# Patient Record
Sex: Female | Born: 1940 | Race: White | Hispanic: No | Smoking: Never smoker
Health system: Southern US, Community
[De-identification: ages and names within clinical notes are randomized; demographics above are authoritative.]

## PROBLEM LIST (undated history)

## (undated) DIAGNOSIS — I340 Nonrheumatic mitral (valve) insufficiency: Secondary | ICD-10-CM

## (undated) DIAGNOSIS — C50919 Malignant neoplasm of unspecified site of unspecified female breast: Secondary | ICD-10-CM

## (undated) DIAGNOSIS — I10 Essential (primary) hypertension: Secondary | ICD-10-CM

## (undated) DIAGNOSIS — H269 Unspecified cataract: Secondary | ICD-10-CM

## (undated) DIAGNOSIS — I2510 Atherosclerotic heart disease of native coronary artery without angina pectoris: Secondary | ICD-10-CM

## (undated) DIAGNOSIS — C801 Malignant (primary) neoplasm, unspecified: Secondary | ICD-10-CM

## (undated) DIAGNOSIS — E039 Hypothyroidism, unspecified: Secondary | ICD-10-CM

## (undated) DIAGNOSIS — C189 Malignant neoplasm of colon, unspecified: Secondary | ICD-10-CM

## (undated) DIAGNOSIS — M1990 Unspecified osteoarthritis, unspecified site: Secondary | ICD-10-CM

## (undated) HISTORY — PX: COLONOSCOPY: SHX174

## (undated) HISTORY — DX: Unspecified cataract: H26.9

## (undated) HISTORY — PX: FOOT SURGERY: SHX648

## (undated) HISTORY — DX: Malignant neoplasm of colon, unspecified: C18.9

## (undated) HISTORY — DX: Malignant neoplasm of unspecified site of unspecified female breast: C50.919

## (undated) HISTORY — DX: Unspecified osteoarthritis, unspecified site: M19.90

## (undated) HISTORY — DX: Essential (primary) hypertension: I10

## (undated) HISTORY — PX: BREAST LUMPECTOMY: SHX2

## (undated) HISTORY — PX: VAGINAL HYSTERECTOMY: SUR661

## (undated) HISTORY — DX: Nonrheumatic mitral (valve) insufficiency: I34.0

## (undated) HISTORY — PX: COLON SURGERY: SHX602

## (undated) HISTORY — DX: Malignant (primary) neoplasm, unspecified: C80.1

## (undated) HISTORY — PX: BUNIONECTOMY: SHX129

---

## 2000-02-14 ENCOUNTER — Other Ambulatory Visit: Admission: RE | Admit: 2000-02-14 | Discharge: 2000-02-14 | Payer: Self-pay | Admitting: Obstetrics and Gynecology

## 2001-02-26 ENCOUNTER — Other Ambulatory Visit: Admission: RE | Admit: 2001-02-26 | Discharge: 2001-02-26 | Payer: Self-pay | Admitting: Obstetrics and Gynecology

## 2001-04-02 ENCOUNTER — Encounter: Payer: Self-pay | Admitting: Obstetrics and Gynecology

## 2001-04-02 ENCOUNTER — Encounter: Admission: RE | Admit: 2001-04-02 | Discharge: 2001-04-02 | Payer: Self-pay | Admitting: Obstetrics and Gynecology

## 2001-09-23 ENCOUNTER — Encounter: Payer: Self-pay | Admitting: General Surgery

## 2001-09-25 ENCOUNTER — Encounter (INDEPENDENT_AMBULATORY_CARE_PROVIDER_SITE_OTHER): Payer: Self-pay | Admitting: Specialist

## 2001-09-25 ENCOUNTER — Inpatient Hospital Stay (HOSPITAL_COMMUNITY): Admission: RE | Admit: 2001-09-25 | Discharge: 2001-09-30 | Payer: Self-pay | Admitting: General Surgery

## 2001-10-27 ENCOUNTER — Encounter: Admission: RE | Admit: 2001-10-27 | Discharge: 2001-10-27 | Payer: Self-pay | Admitting: Oncology

## 2001-10-27 ENCOUNTER — Encounter (HOSPITAL_COMMUNITY): Admission: RE | Admit: 2001-10-27 | Discharge: 2001-11-26 | Payer: Self-pay | Admitting: Oncology

## 2002-01-21 ENCOUNTER — Encounter (HOSPITAL_COMMUNITY): Admission: RE | Admit: 2002-01-21 | Discharge: 2002-02-20 | Payer: Self-pay | Admitting: Oncology

## 2002-01-21 ENCOUNTER — Encounter: Admission: RE | Admit: 2002-01-21 | Discharge: 2002-01-21 | Payer: Self-pay | Admitting: Oncology

## 2002-03-18 ENCOUNTER — Other Ambulatory Visit: Admission: RE | Admit: 2002-03-18 | Discharge: 2002-03-18 | Payer: Self-pay | Admitting: Gynecology

## 2002-05-06 ENCOUNTER — Encounter: Admission: RE | Admit: 2002-05-06 | Discharge: 2002-05-06 | Payer: Self-pay | Admitting: Oncology

## 2002-05-06 ENCOUNTER — Encounter: Payer: Self-pay | Admitting: Gynecology

## 2002-05-06 ENCOUNTER — Encounter (HOSPITAL_COMMUNITY): Admission: RE | Admit: 2002-05-06 | Discharge: 2002-06-05 | Payer: Self-pay | Admitting: Oncology

## 2002-05-06 ENCOUNTER — Encounter: Admission: RE | Admit: 2002-05-06 | Discharge: 2002-05-06 | Payer: Self-pay | Admitting: Gynecology

## 2002-08-05 ENCOUNTER — Encounter: Admission: RE | Admit: 2002-08-05 | Discharge: 2002-08-05 | Payer: Self-pay | Admitting: Oncology

## 2002-08-05 ENCOUNTER — Encounter (HOSPITAL_COMMUNITY): Admission: RE | Admit: 2002-08-05 | Discharge: 2002-09-04 | Payer: Self-pay | Admitting: Oncology

## 2002-10-28 ENCOUNTER — Encounter (HOSPITAL_COMMUNITY): Admission: RE | Admit: 2002-10-28 | Discharge: 2002-11-27 | Payer: Self-pay | Admitting: Oncology

## 2002-10-28 ENCOUNTER — Encounter: Admission: RE | Admit: 2002-10-28 | Discharge: 2002-10-28 | Payer: Self-pay | Admitting: Oncology

## 2003-02-03 ENCOUNTER — Encounter (HOSPITAL_COMMUNITY): Admission: RE | Admit: 2003-02-03 | Discharge: 2003-02-25 | Payer: Self-pay | Admitting: Oncology

## 2003-02-03 ENCOUNTER — Encounter: Admission: RE | Admit: 2003-02-03 | Discharge: 2003-02-03 | Payer: Self-pay | Admitting: Oncology

## 2003-03-31 ENCOUNTER — Other Ambulatory Visit: Admission: RE | Admit: 2003-03-31 | Discharge: 2003-03-31 | Payer: Self-pay | Admitting: Gynecology

## 2003-05-12 ENCOUNTER — Encounter (HOSPITAL_COMMUNITY): Admission: RE | Admit: 2003-05-12 | Discharge: 2003-06-11 | Payer: Self-pay | Admitting: Oncology

## 2003-05-12 ENCOUNTER — Encounter: Admission: RE | Admit: 2003-05-12 | Discharge: 2003-05-12 | Payer: Self-pay | Admitting: Oncology

## 2003-06-09 ENCOUNTER — Encounter: Admission: RE | Admit: 2003-06-09 | Discharge: 2003-06-09 | Payer: Self-pay | Admitting: Gynecology

## 2003-08-11 ENCOUNTER — Encounter: Admission: RE | Admit: 2003-08-11 | Discharge: 2003-08-11 | Payer: Self-pay | Admitting: Oncology

## 2003-08-11 ENCOUNTER — Encounter (HOSPITAL_COMMUNITY): Admission: RE | Admit: 2003-08-11 | Discharge: 2003-09-10 | Payer: Self-pay | Admitting: Oncology

## 2003-11-10 ENCOUNTER — Encounter: Admission: RE | Admit: 2003-11-10 | Discharge: 2003-11-10 | Payer: Self-pay | Admitting: Oncology

## 2003-11-10 ENCOUNTER — Encounter (HOSPITAL_COMMUNITY): Admission: RE | Admit: 2003-11-10 | Discharge: 2003-12-10 | Payer: Self-pay | Admitting: Oncology

## 2004-02-18 ENCOUNTER — Encounter (HOSPITAL_COMMUNITY): Admission: RE | Admit: 2004-02-18 | Discharge: 2004-02-25 | Payer: Self-pay | Admitting: Oncology

## 2004-02-18 ENCOUNTER — Encounter: Admission: RE | Admit: 2004-02-18 | Discharge: 2004-02-25 | Payer: Self-pay | Admitting: Oncology

## 2004-03-22 ENCOUNTER — Other Ambulatory Visit: Admission: RE | Admit: 2004-03-22 | Discharge: 2004-03-22 | Payer: Self-pay | Admitting: Gynecology

## 2004-06-07 ENCOUNTER — Encounter (HOSPITAL_COMMUNITY): Admission: RE | Admit: 2004-06-07 | Discharge: 2004-07-07 | Payer: Self-pay | Admitting: Oncology

## 2004-06-07 ENCOUNTER — Ambulatory Visit (HOSPITAL_COMMUNITY): Payer: Self-pay | Admitting: Oncology

## 2004-06-07 ENCOUNTER — Encounter: Admission: RE | Admit: 2004-06-07 | Discharge: 2004-06-07 | Payer: Self-pay | Admitting: Oncology

## 2004-06-14 ENCOUNTER — Encounter: Admission: RE | Admit: 2004-06-14 | Discharge: 2004-06-14 | Payer: Self-pay | Admitting: Gynecology

## 2004-08-30 ENCOUNTER — Ambulatory Visit (HOSPITAL_COMMUNITY): Payer: Self-pay | Admitting: Oncology

## 2004-08-30 ENCOUNTER — Encounter (HOSPITAL_COMMUNITY): Admission: RE | Admit: 2004-08-30 | Discharge: 2004-09-29 | Payer: Self-pay | Admitting: Oncology

## 2004-08-30 ENCOUNTER — Encounter: Admission: RE | Admit: 2004-08-30 | Discharge: 2004-08-30 | Payer: Self-pay | Admitting: Oncology

## 2004-09-20 ENCOUNTER — Ambulatory Visit: Payer: Self-pay | Admitting: Gastroenterology

## 2004-10-02 ENCOUNTER — Ambulatory Visit: Payer: Self-pay | Admitting: Gastroenterology

## 2004-10-25 ENCOUNTER — Encounter (HOSPITAL_COMMUNITY): Admission: RE | Admit: 2004-10-25 | Discharge: 2004-11-24 | Payer: Self-pay | Admitting: Oncology

## 2004-10-25 ENCOUNTER — Ambulatory Visit (HOSPITAL_COMMUNITY): Payer: Self-pay | Admitting: Oncology

## 2004-10-25 ENCOUNTER — Encounter: Admission: RE | Admit: 2004-10-25 | Discharge: 2004-10-25 | Payer: Self-pay | Admitting: Oncology

## 2005-01-24 ENCOUNTER — Ambulatory Visit (HOSPITAL_COMMUNITY): Payer: Self-pay | Admitting: Oncology

## 2005-01-24 ENCOUNTER — Encounter: Admission: RE | Admit: 2005-01-24 | Discharge: 2005-02-23 | Payer: Self-pay | Admitting: Oncology

## 2005-01-24 ENCOUNTER — Encounter (HOSPITAL_COMMUNITY): Admission: RE | Admit: 2005-01-24 | Discharge: 2005-02-23 | Payer: Self-pay | Admitting: Oncology

## 2005-03-28 ENCOUNTER — Other Ambulatory Visit: Admission: RE | Admit: 2005-03-28 | Discharge: 2005-03-28 | Payer: Self-pay | Admitting: Gynecology

## 2005-06-27 ENCOUNTER — Encounter: Admission: RE | Admit: 2005-06-27 | Discharge: 2005-06-27 | Payer: Self-pay | Admitting: Gynecology

## 2005-06-27 ENCOUNTER — Encounter (HOSPITAL_COMMUNITY): Admission: RE | Admit: 2005-06-27 | Discharge: 2005-07-27 | Payer: Self-pay | Admitting: Oncology

## 2005-06-27 ENCOUNTER — Ambulatory Visit (HOSPITAL_COMMUNITY): Payer: Self-pay | Admitting: Oncology

## 2005-07-04 ENCOUNTER — Encounter (INDEPENDENT_AMBULATORY_CARE_PROVIDER_SITE_OTHER): Payer: Self-pay | Admitting: Diagnostic Radiology

## 2005-07-04 ENCOUNTER — Encounter: Admission: RE | Admit: 2005-07-04 | Discharge: 2005-07-04 | Payer: Self-pay | Admitting: Gynecology

## 2005-07-04 ENCOUNTER — Encounter (INDEPENDENT_AMBULATORY_CARE_PROVIDER_SITE_OTHER): Payer: Self-pay | Admitting: Specialist

## 2005-07-12 ENCOUNTER — Encounter: Admission: RE | Admit: 2005-07-12 | Discharge: 2005-07-12 | Payer: Self-pay | Admitting: General Surgery

## 2005-08-03 ENCOUNTER — Encounter: Admission: RE | Admit: 2005-08-03 | Discharge: 2005-08-03 | Payer: Self-pay | Admitting: General Surgery

## 2005-08-03 ENCOUNTER — Ambulatory Visit (HOSPITAL_BASED_OUTPATIENT_CLINIC_OR_DEPARTMENT_OTHER): Admission: RE | Admit: 2005-08-03 | Discharge: 2005-08-03 | Payer: Self-pay | Admitting: General Surgery

## 2005-08-03 ENCOUNTER — Encounter (INDEPENDENT_AMBULATORY_CARE_PROVIDER_SITE_OTHER): Payer: Self-pay | Admitting: *Deleted

## 2005-08-21 ENCOUNTER — Encounter: Admission: RE | Admit: 2005-08-21 | Discharge: 2005-08-21 | Payer: Self-pay | Admitting: Oncology

## 2005-08-21 ENCOUNTER — Encounter (HOSPITAL_COMMUNITY): Admission: RE | Admit: 2005-08-21 | Discharge: 2005-09-20 | Payer: Self-pay | Admitting: Oncology

## 2005-08-21 ENCOUNTER — Ambulatory Visit (HOSPITAL_COMMUNITY): Payer: Self-pay | Admitting: Oncology

## 2005-09-05 ENCOUNTER — Ambulatory Visit: Admission: RE | Admit: 2005-09-05 | Discharge: 2005-11-20 | Payer: Self-pay | Admitting: Radiation Oncology

## 2005-12-05 ENCOUNTER — Ambulatory Visit (HOSPITAL_COMMUNITY): Payer: Self-pay | Admitting: Oncology

## 2005-12-05 ENCOUNTER — Encounter: Admission: RE | Admit: 2005-12-05 | Discharge: 2005-12-05 | Payer: Self-pay | Admitting: Oncology

## 2006-02-27 ENCOUNTER — Encounter (HOSPITAL_COMMUNITY): Admission: RE | Admit: 2006-02-27 | Discharge: 2006-03-29 | Payer: Self-pay | Admitting: Oncology

## 2006-02-27 ENCOUNTER — Encounter: Admission: RE | Admit: 2006-02-27 | Discharge: 2006-02-27 | Payer: Self-pay | Admitting: Oncology

## 2006-02-27 ENCOUNTER — Ambulatory Visit (HOSPITAL_COMMUNITY): Payer: Self-pay | Admitting: Oncology

## 2006-03-20 ENCOUNTER — Encounter: Admission: RE | Admit: 2006-03-20 | Discharge: 2006-03-20 | Payer: Self-pay | Admitting: Oncology

## 2006-04-11 ENCOUNTER — Other Ambulatory Visit: Admission: RE | Admit: 2006-04-11 | Discharge: 2006-04-11 | Payer: Self-pay | Admitting: Gynecology

## 2006-05-08 ENCOUNTER — Ambulatory Visit (HOSPITAL_COMMUNITY): Payer: Self-pay | Admitting: Oncology

## 2006-06-05 ENCOUNTER — Encounter (HOSPITAL_COMMUNITY): Admission: RE | Admit: 2006-06-05 | Discharge: 2006-07-05 | Payer: Self-pay | Admitting: Oncology

## 2006-07-03 ENCOUNTER — Encounter: Admission: RE | Admit: 2006-07-03 | Discharge: 2006-07-03 | Payer: Self-pay | Admitting: Gynecology

## 2006-08-21 ENCOUNTER — Ambulatory Visit (HOSPITAL_COMMUNITY): Payer: Self-pay | Admitting: Oncology

## 2006-08-21 ENCOUNTER — Encounter (HOSPITAL_COMMUNITY): Admission: RE | Admit: 2006-08-21 | Discharge: 2006-09-20 | Payer: Self-pay | Admitting: Oncology

## 2006-12-11 ENCOUNTER — Ambulatory Visit (HOSPITAL_COMMUNITY): Payer: Self-pay | Admitting: Oncology

## 2006-12-11 ENCOUNTER — Encounter (HOSPITAL_COMMUNITY): Admission: RE | Admit: 2006-12-11 | Discharge: 2007-01-10 | Payer: Self-pay | Admitting: Oncology

## 2007-02-27 ENCOUNTER — Encounter (HOSPITAL_COMMUNITY): Admission: RE | Admit: 2007-02-27 | Discharge: 2007-03-29 | Payer: Self-pay | Admitting: Oncology

## 2007-02-27 ENCOUNTER — Ambulatory Visit (HOSPITAL_COMMUNITY): Payer: Self-pay | Admitting: Oncology

## 2007-05-01 ENCOUNTER — Other Ambulatory Visit: Admission: RE | Admit: 2007-05-01 | Discharge: 2007-05-01 | Payer: Self-pay | Admitting: Gynecology

## 2007-06-20 ENCOUNTER — Ambulatory Visit (HOSPITAL_COMMUNITY): Payer: Self-pay | Admitting: Oncology

## 2007-07-17 ENCOUNTER — Encounter: Admission: RE | Admit: 2007-07-17 | Discharge: 2007-07-17 | Payer: Self-pay | Admitting: Gynecology

## 2007-09-18 ENCOUNTER — Encounter (HOSPITAL_COMMUNITY): Admission: RE | Admit: 2007-09-18 | Discharge: 2007-10-18 | Payer: Self-pay | Admitting: Oncology

## 2007-09-18 ENCOUNTER — Ambulatory Visit (HOSPITAL_COMMUNITY): Payer: Self-pay | Admitting: Oncology

## 2007-11-27 ENCOUNTER — Telehealth: Payer: Self-pay | Admitting: Gastroenterology

## 2007-12-11 ENCOUNTER — Encounter (HOSPITAL_COMMUNITY): Admission: RE | Admit: 2007-12-11 | Discharge: 2008-01-10 | Payer: Self-pay | Admitting: Oncology

## 2007-12-11 ENCOUNTER — Encounter: Payer: Self-pay | Admitting: Gastroenterology

## 2007-12-11 ENCOUNTER — Ambulatory Visit (HOSPITAL_COMMUNITY): Payer: Self-pay | Admitting: Oncology

## 2008-04-08 ENCOUNTER — Ambulatory Visit: Payer: Self-pay | Admitting: Gastroenterology

## 2008-04-19 ENCOUNTER — Ambulatory Visit: Payer: Self-pay | Admitting: Gastroenterology

## 2008-06-25 ENCOUNTER — Ambulatory Visit (HOSPITAL_COMMUNITY): Payer: Self-pay | Admitting: Oncology

## 2008-06-25 ENCOUNTER — Encounter: Payer: Self-pay | Admitting: Gastroenterology

## 2008-07-22 ENCOUNTER — Encounter: Admission: RE | Admit: 2008-07-22 | Discharge: 2008-07-22 | Payer: Self-pay | Admitting: Gynecology

## 2008-12-24 ENCOUNTER — Encounter: Payer: Self-pay | Admitting: Gastroenterology

## 2008-12-24 ENCOUNTER — Encounter (HOSPITAL_COMMUNITY): Admission: RE | Admit: 2008-12-24 | Discharge: 2009-01-23 | Payer: Self-pay | Admitting: Oncology

## 2008-12-24 ENCOUNTER — Ambulatory Visit (HOSPITAL_COMMUNITY): Payer: Self-pay | Admitting: Oncology

## 2009-07-05 ENCOUNTER — Ambulatory Visit (HOSPITAL_COMMUNITY): Payer: Self-pay | Admitting: Oncology

## 2009-07-25 ENCOUNTER — Encounter: Admission: RE | Admit: 2009-07-25 | Discharge: 2009-07-25 | Payer: Self-pay | Admitting: Gynecology

## 2010-02-06 ENCOUNTER — Ambulatory Visit (HOSPITAL_COMMUNITY): Payer: Self-pay | Admitting: Oncology

## 2010-02-06 ENCOUNTER — Encounter: Payer: Self-pay | Admitting: Gastroenterology

## 2010-06-18 ENCOUNTER — Encounter: Payer: Self-pay | Admitting: Gynecology

## 2010-06-18 ENCOUNTER — Encounter: Payer: Self-pay | Admitting: Family Medicine

## 2010-06-27 NOTE — Letter (Signed)
Summary: [facility]  [facility]   Imported By: Lester [place] 02/21/2010 13:12:24  _____________________________________________________________________  External Attachment:    Type:   Image     Comment:   External Document

## 2010-07-24 ENCOUNTER — Other Ambulatory Visit: Payer: Self-pay | Admitting: Gynecology

## 2010-07-24 DIAGNOSIS — Z9889 Other specified postprocedural states: Secondary | ICD-10-CM

## 2010-08-08 ENCOUNTER — Ambulatory Visit
Admission: RE | Admit: 2010-08-08 | Discharge: 2010-08-08 | Disposition: A | Discharge status: Home / Self Care | Payer: PRIVATE HEALTH INSURANCE | Source: Ambulatory Visit | Attending: Gynecology | Admitting: Gynecology

## 2010-08-08 ENCOUNTER — Other Ambulatory Visit: Payer: Self-pay | Admitting: Gynecology

## 2010-08-08 DIAGNOSIS — Z9889 Other specified postprocedural states: Secondary | ICD-10-CM

## 2010-09-03 LAB — DIFFERENTIAL
Lymphs Abs: 1.2 10*3/uL (ref 0.7–4.0)
Monocytes Relative: 9 % (ref 3–12)
Neutrophils Relative %: 68 % (ref 43–77)

## 2010-09-03 LAB — COMPREHENSIVE METABOLIC PANEL
BUN: 9 mg/dL (ref 6–23)
Chloride: 102 mEq/L (ref 96–112)
GFR calc Af Amer: >60 mL/min (ref >60)
GFR calc non Af Amer: >60 mL/min (ref >60)
Potassium: 3.8 mEq/L (ref 3.5–5.1)
Total Bilirubin: 0.5 mg/dL (ref 0.3–1.2)

## 2010-09-03 LAB — CEA: CEA: 1.3 ng/mL (ref 0.0–5.0)

## 2010-09-03 LAB — CBC
HCT: 39.5 % (ref 36.0–46.0)
Hemoglobin: 14.1 g/dL (ref 12.0–15.0)
MCV: 93.8 fL (ref 78.0–100.0)

## 2010-10-13 NOTE — Op Note (Signed)
[facility]  Patient:    Kathryn Powell, Kathryn Powell Visit Number: [id number] MRN: [id number]          Service Type: SUR Location: 3W 0358 01 Attending Physician:  Carson Myrtle Dictated by:   Sheppard Plumber Earlene Plater, M.D. Proc. Date: 09/25/01 Admit Date:  09/25/2001   CC:         Vania Rea. Jarold Motto, M.D. 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  PROCEDURE:  Sigmoid colectomy.  SURGEON:  Timothy E. Earlene Plater, M.D.  ASSISTANT:  Sandria Bales. Ezzard Standing, M.D.  ANESTHESIA:  CRNA supervised Dr. Kateri Plummer.  INDICATIONS FOR PROCEDURE:  Kathryn Powell is a healthy, 61 and had some increasing constipation recently and several months history of blood in the stool, Hemoccult positive. Colonoscopy by Dr. Jarold Motto revealed near obstructing sigmoid carcinoma. She was rapidly prepared for surgery and agrees and understands the proposed procedure. Her preoperative PA and lateral chest, EKG, chemistry profile and CBC were normal. CEA level was 1.3.  The patient was prepared at home, brought into the hospital this morning, evaluated by anesthesia and was ready for surgery. IV started, hydration begun and preoperative antibiotics given.  DESCRIPTION OF PROCEDURE:  The patient was taken to the operating room, placed supine, general endotracheal anesthesia administered. PAS hose applied, a Foley catheter inserted and she was placed in the yellowfin stirrups and placed supine. A nasogastric tube passed. The abdomen was scrubbed, prepped and draped in the usual fashion. A short lower midline incision was used to enter the abdominal cavity. Careful palpation of the upper abdominal viscera were all negative. The colon was carefully palpated in its entirety and there were no other lesions. A moderate apple core type lesion was found in the sigmoid colon which was coiled  in the pelvis and adherent by simple adhesions to the left pelvic sidewall. The peritoneal reflection was divided from the descending colon to the rectum. The adhesions were divided. The left tube exhibited hydrosalpinx. No other disease was noted. Sites were chosen for resection at the upper rectum and mid sigmoid. There was no other disease of the sigmoid colon. The colon was divided proximally. The mesentery was divided down to the posterior peritoneum where both ureters were identified and kept lateral to the dissection. The mesentery was divided between clamps up to the proximal rectum which also was divided. There was on spillage and the prep was excellent. An open end-to-end hand-sewn anastomosis was created using interrupted 3-0 silk sutures. There was absolutely no tension on the anastomosis and it lay nicely over the pelvic brim. Gloves and instruments changed. Irrigation carried out and was clear. There was no bleeding. The midline peritoneum was tacked to the remaining peritoneum on the newly anastomosed colon. Again irrigation was clear, preliminary counts were correct. The viscera was returned to the mid abdomen. The omentum was pulled down over the viscera and closure was accomplished with a single layer of #1 PDS suture including dissection, debridement and closure of the small umbilical hernia. The subcu was irrigated and the skin was closed with wide skin staples. Final counts correct. She tolerated it well. Estimated blood loss less than 100 cc. She was removed to the recovery room in good condition. Dictated by:   Sheppard Plumber Earlene Plater, M.D. Attending Physician:  Carson Myrtle DD:  09/25/01 TD:  09/26/01 Job: [id number]

## 2010-10-13 NOTE — Op Note (Signed)
NAME:  Kathryn Powell, Kathryn Powell               ACCOUNT NO.:  [id number]   MEDICAL RECORD NO.:  [id number]          PATIENT TYPE:  AMB   LOCATION:  DSC                          FACILITY:  [facility]   PHYSICIAN:  Timothy E. Earlene Plater, M.D. DATE OF BIRTH:  [birth year]   DATE OF PROCEDURE:  08/03/2005  DATE OF DISCHARGE:                                 OPERATIVE REPORT   PREOPERATIVE DIAGNOSIS:  Cancer of left breast.   POSTOPERATIVE DIAGNOSIS:  Cancer of left breast.   OPERATION PERFORMED:  Partial mastectomy, left, with needle localization,  dye injection and sentinel node biopsy.   SURGEON:  Timothy E. Earlene Plater, M.D.   ANESTHESIA:  General.   INDICATIONS FOR PROCEDURE:  Kathryn Powell had routine screening with  architectural distortion found.  Core biopsy made showing invasive carcinoma  that is lower inner quadrant left breast.  She has been carefully evaluated  and MRI confirms and though the lesion is nonpalpable, it's greatest  dimension is 2 cm.  So she is prepared for this surgery.   She was seen at [facility], had a needle localization done  this afternoon, arrived at the [facility] where a nuclear  injection was made for sentinel node.  The patient was seen and evaluated,  marked the left breast and permit signed.   DESCRIPTION OF PROCEDURE:  The patient was taken to the operating room,  placed supine, LMA anesthesia provided. The left breast was exposed.  It was  prepped and draped in the usual fashion. Diluted blue dye was injected  subdermal and subcuticular around the left nipple and massaged for five  minutes.  Then using the NeoProbe, the breast counts were in the 20,000  range. There were no counts along the sternum or the supraclavicular areas.  In the axilla, one area appeared hot with counts of 200.  That location was  marked.  Marcaine 0.5% with epinephrine was used prior to all incisions.  Small incision made in the left axilla, dissection  carried down through the  superficial subcutaneous tissue and the deep space of the axilla entered.  Again using NeoProbe for directional, the lymph node area was located,  grasped, again was hot with the NeoProbe and there was one blue node.  As I  dissected this out, there appeared to be a cluster of nodes, only one was  blue.  At least two were hot.  This was dissected from the surrounding  tissue carefully with the cautery.  These were sent to the lab as left  axillary sentinel nodes. That area was covered.  Attention was turned to the  breast using the x-rays and the needle localizing wire.  The tumor was in  the  lower inner quadrant nearer to the nipple than the medial portion of  the breast.  The amount of needle shaft within the breast was 5 cm and the  tumor was located between the third and fourth centimeter.  It punctured the  skin on the medial aspect of the breast, was directed laterally into the  tumor mass.  A curvilinear incision  was made and the wire was brought into  the operative field.  A generous partial mastectomy was created by  retracting the skin edges, dissecting the superficial subcu along with the  specimen, completely around the entrance of the needle into the breast  tissue and well up under the nipple areolar complex and down to the  pectoralis fascia posteriorly and down to the inferior aspect of the breast,  all in the subcu space.  This entire mass was then marked, one string  anterior, two strings lateral, three strings superior.  It was sent to x-  ray.  Meanwhile bleeding was controlled.  That wound was dry and we closed  both wounds with Vicryl and Monocryl.  We did receive word then from Dr.  Clelia Powell that the lymph nodes were negative.  I received word from the  radiologist at [facility] that the x-ray was satisfactory.  I sent the specimens in  to Dr. Clelia Powell, who inked it, cut it and felt that the anterior margin was too  close.  The wounds were opened.  I  excised a strip of skin on the superior  aspect of the previously made wound, the inferior aspect of the previously  made wound and then I removed all of the subcutaneous tissue from the  existing wound.  This did include some breast tissue from the most inferior  lateral portion of the wound cavity. These were submitted as specimens 3, 4  and 5.  Bleeding was controlled, the wound was reclosed, Steri-Strips  applied.  All counts correct.  She had tolerated it well and was removed to  recovery room.   Of course, again strips and dry sterile dressing applied.  Written and  verbal instructions given her and her children and she will be followed in  the office.      Timothy E. Earlene Plater, M.D.  Electronically Signed     TED/MEDQ  D:  08/03/2005  T:  08/06/2005  Job:  [id number]   cc:   [facility]   [facility] S. Mariel Sleet, MD  Fax: [phone]   Kathryn Powell, M.D.  Fax: [phone]

## 2010-10-13 NOTE — Discharge Summary (Signed)
[facility]  Patient:    Kathryn Powell, CHESNUT Visit Number: [id number] MRN: [id number]          Service Type: SUR Location: 3W 0358 01 Attending Physician:  Carson Myrtle Dictated by:   Sheppard Plumber Earlene Plater, M.D. Proc. Date: 09/30/01 Admit Date:  09/25/2001 Discharge Date: 09/30/2001   CC:         Vania Rea. Jarold Motto, M.D. [facility]   Discharge Summary  FINAL DIAGNOSIS:  Adenocarcinoma of the sigmoid colon.  HISTORY AND PHYSICAL:  See the enclosed notes.  The patient was discovered by Dr. Sheryn Bison to have a near-obstructing carcinoma of the sigmoid colon at 30 cm, biopsy positive.  A rapid work-up was accomplished, and the patient was ready to proceed with surgery.  Her preliminary CBC, chemistry profile, and CEA levels were all completely normal.  Her chest x-ray was normal.   Her cardiogram was normal.  HOSPITAL COURSE:  She was prepared, brought to the hospital, and operated upon with an otherwise negative exploratory laparotomy.  A near-obstructing, small, donut-type carcinoma of the sigmoid colon without apparent metastasis.  She had an absolutely smooth and near perfect recovery without complications.  She was ambulatory and tolerated a diet on the third day.  Bowel action resumed, and she was ready for discharge on the fifth day.  The wound was primary; she was afebrile.  No repeat lab work was done.  Final pathology report was returned showing moderately differentiated colonic adenocarcinoma, extending into the pericolic adipose tissue, 13 lymph nodes negative.  This is a T3, N0 lesion.  The patient will be seen in consultation by oncology in her postoperative recovery.  She is discharged home with instructions and Darvocet and to be seen and followed in the office. Dictated by:   Sheppard Plumber Earlene Plater, M.D. Attending Physician:  Carson Myrtle DD:  09/30/01 TD:  09/30/01 Job: [id number]

## 2011-02-05 ENCOUNTER — Ambulatory Visit (HOSPITAL_COMMUNITY): Payer: Self-pay | Admitting: Oncology

## 2011-02-12 ENCOUNTER — Encounter (HOSPITAL_COMMUNITY): Payer: Self-pay | Admitting: Oncology

## 2011-02-12 ENCOUNTER — Encounter (HOSPITAL_COMMUNITY): Payer: PRIVATE HEALTH INSURANCE | Attending: Oncology | Admitting: Oncology

## 2011-02-12 VITALS — BP 125/72 | HR 66 | Temp 97.5°F | Wt 158.2 lb

## 2011-02-12 DIAGNOSIS — E039 Hypothyroidism, unspecified: Secondary | ICD-10-CM | POA: Insufficient documentation

## 2011-02-12 DIAGNOSIS — Z853 Personal history of malignant neoplasm of breast: Secondary | ICD-10-CM | POA: Insufficient documentation

## 2011-02-12 DIAGNOSIS — Z170 Estrogen receptor positive status [ER+]: Secondary | ICD-10-CM

## 2011-02-12 DIAGNOSIS — Z09 Encounter for follow-up examination after completed treatment for conditions other than malignant neoplasm: Secondary | ICD-10-CM | POA: Insufficient documentation

## 2011-02-12 DIAGNOSIS — Z85038 Personal history of other malignant neoplasm of large intestine: Secondary | ICD-10-CM | POA: Insufficient documentation

## 2011-02-12 DIAGNOSIS — I10 Essential (primary) hypertension: Secondary | ICD-10-CM | POA: Insufficient documentation

## 2011-02-12 DIAGNOSIS — C50319 Malignant neoplasm of lower-inner quadrant of unspecified female breast: Secondary | ICD-10-CM

## 2011-02-12 DIAGNOSIS — C189 Malignant neoplasm of colon, unspecified: Secondary | ICD-10-CM

## 2011-02-12 LAB — COMPREHENSIVE METABOLIC PANEL
Alkaline Phosphatase: 60 U/L (ref 39–117)
BUN: 9 mg/dL (ref 6–23)
CO2: 32 mEq/L (ref 19–32)
Chloride: 93 mEq/L — ABNORMAL LOW (ref 96–112)
Creatinine, Ser: 0.59 mg/dL (ref 0.50–1.10)
GFR calc Af Amer: >60 mL/min (ref >60)
GFR calc non Af Amer: >60 mL/min (ref >60)
Glucose, Bld: 91 mg/dL (ref 70–99)
Potassium: 4 mEq/L (ref 3.5–5.1)
Total Bilirubin: 0.5 mg/dL (ref 0.3–1.2)

## 2011-02-12 LAB — CBC
HCT: 40.6 % (ref 36.0–46.0)
RDW: 12.5 % (ref 11.5–15.5)
WBC: 6.9 10*3/uL (ref 4.0–10.5)

## 2011-02-12 NOTE — Progress Notes (Signed)
CC:   Wyvonnia Lora, MD Maryln Gottron, M.D. Vania Rea. Jarold Motto, MD, Clementeen Graham, FACP, FAGA  DIAGNOSES: 1. Stage I (T1C N0 M0 grade 1) left-sided breast cancer 1.3 cm in     size, status post lumpectomy with 2 sentinel nodes that were     negative followed by radiation therapy and then 5 years of     tamoxifen which finished as of April 2012.  Her date of surgery was     08/03/2005.  ER receptor is 97%, PR receptor is 98%.  Ki-67 marker     low at 6%.  HER-2/neu was 2+ but negative by FISH and no LVI was     seen. 2. 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 Kendrick Ranch on 09/25/2001 with a negative     repeat colonoscopy November 2009 and Dr. Jarold Motto wants to do one     5 years from that point in time.  She did not receive adjuvant     chemotherapy at that time and CEAs have been normal ever since. 3. Head titubation now on metoprolol.  She does not know the dose, but     it is b.i.d. 4. History of mitral valve prolapse, but I do not hear a murmur or     click today. 5. History of estrogen vaginal cream usage in the past that has been     stopped. 6. 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 7. Hypertension well controlled. 8. Bunionectomy on the left. 9. Right ankle fracture repair by Dr. Weyman Croon after colon cancer     surgery. 10.Hysterectomy 1982 for atypical cells though she retains her     ovaries. 11.Intermittent constipation still an issue. 12.Sulfonamide sensitivity which gives her skin rash intermittently     based upon the quantity of food that she consume she states.  Kiffany is here today for followup.  She has done very well, now on metoprolol though she does not remember the dose.  It is a b.i.d. dosage and she states "it is a very low dose."  She has no changes in her bowels, still mildly constipated which is not new or different.  No blood in her stools.  No abdominal pain.  No lumps anywhere.  No lumps in her breast.  She  is still on schedule for mammography etc.  She finished her tamoxifen and is glad to be off of it.  After the tamoxifen was stopped, she stopped using the estrogen vaginally and she has not noticed any problems.  PHYSICAL EXAMINATION:  Vital signs which are very stable.  Her weight is 158 pounds that is actually down from 167 last year.  Her weight does fluctuate however.  Blood pressure 125/72 today.  Pulse right around 66 and regular.  Respirations 16-18 and unlabored at rest.  Temperature is normal.  Skin is warm and dry to the touch.  There are no abnormal moles, but she does have some pigmented lesions and cherry hemangiomata, etc.  She has no abnormal breath sounds.  Lungs are clear to auscultation and percussion.  Heart shows a regular rhythm and rate without obvious murmur, rub or gallop today.  The left breast is slightly distorted from the surgery and the radiation, but there are no masses.  The right breast is negative for masses.  Her lymph nodes are negative throughout.  Her abdomen is soft and nontender without splenomegaly or masses.  Bowel sounds are  normal.  She has no abdominal distention.  No peripheral edema.  She looks very good.  She was wondering when I can release her, but I told her from the standpoint of the colon cancer I could probably release her now, but from the standpoint of the breast cancer we will need to watch her once a year at least.  She is fine with this.  Will do some baseline blood work.  Will do a CEA today and maybe next year, but that will probably be the last CEAs we check on her.  I think a CBC and CMET would be the most that we will need to do in the future from our standpoint.  We will see her sooner if need be.    ______________________________ Ladona Horns. Mariel Sleet, MD ESN/MEDQ  D:  02/12/2011  T:  02/12/2011  Job:  [id number]

## 2011-02-12 NOTE — Progress Notes (Signed)
Addended byLeida Lauth on: 02/12/2011 10:35 AM   Modules accepted: Orders

## 2011-02-12 NOTE — Progress Notes (Signed)
Needs colonoscopy with propofol.

## 2011-02-12 NOTE — Patient Instructions (Signed)
[facility]  Discharge Instructions  RECOMMENDATIONS MADE BY THE CONSULTANT AND ANY TEST RESULTS WILL BE SENT TO YOUR REFERRING DOCTOR.   EXAM FINDINGS BY MD TODAY AND SIGNS AND SYMPTOMS TO REPORT TO CLINIC OR PRIMARY MD: Exam good. We will get some labs today.    INSTRUCTIONS GIVEN AND DISCUSSED: Other : Report any new problems.  SPECIAL INSTRUCTIONS/FOLLOW-UP: Return to Clinic in one year.  I acknowledge that I have been informed and understand all the instructions given to me and received a copy. I do not have any more questions at this time, but understand that I may call the Specialty Clinic at [facility] at [phone] during business hours should I have any further questions or need assistance in obtaining follow-up care.    __________________________________________  _____________  __________ Signature of Patient or Authorized Representative            Date                   Time    __________________________________________ Nurse's Signature

## 2011-02-12 NOTE — Progress Notes (Signed)
This office note has been dictated.

## 2011-02-13 ENCOUNTER — Telehealth: Payer: Self-pay | Admitting: *Deleted

## 2011-02-13 NOTE — Telephone Encounter (Signed)
Per Dr. Jarold Motto patient needs colonoscopy with MAC and a previsit.

## 2011-02-14 ENCOUNTER — Encounter: Payer: Self-pay | Admitting: Gastroenterology

## 2011-02-14 NOTE — Telephone Encounter (Signed)
Colon scheduled 10/8

## 2011-02-20 ENCOUNTER — Ambulatory Visit (AMBULATORY_SURGERY_CENTER): Payer: PRIVATE HEALTH INSURANCE

## 2011-02-20 VITALS — Ht 67.0 in | Wt 159.0 lb

## 2011-02-20 DIAGNOSIS — Z85038 Personal history of other malignant neoplasm of large intestine: Secondary | ICD-10-CM

## 2011-02-20 DIAGNOSIS — Z86010 Personal history of colonic polyps: Secondary | ICD-10-CM

## 2011-02-20 LAB — CEA: CEA: 0.7

## 2011-02-20 MED ORDER — PEG-KCL-NACL-NASULF-NA ASC-C 100 G PO SOLR: 1.0000 | Freq: Once | ORAL | Status: AC

## 2011-03-05 ENCOUNTER — Encounter: Payer: Self-pay | Admitting: Gastroenterology

## 2011-03-05 ENCOUNTER — Ambulatory Visit (AMBULATORY_SURGERY_CENTER): Payer: PRIVATE HEALTH INSURANCE | Admitting: Gastroenterology

## 2011-03-05 DIAGNOSIS — Z86010 Personal history of colonic polyps: Secondary | ICD-10-CM

## 2011-03-05 DIAGNOSIS — Z85038 Personal history of other malignant neoplasm of large intestine: Secondary | ICD-10-CM

## 2011-03-05 DIAGNOSIS — Z1211 Encounter for screening for malignant neoplasm of colon: Secondary | ICD-10-CM

## 2011-03-05 MED ORDER — SODIUM CHLORIDE 0.9 % IV SOLN: 500.0000 mL | INTRAVENOUS | Status: DC

## 2011-03-05 NOTE — Patient Instructions (Signed)
Green and blue discharge instructions reviewed with patient and care partner.  Impressions/recommendations:  No polyps or cancer. Poor prep.  Repeat exam in 3 years.  Resume medications as you were taking them prior to your procedure.

## 2011-03-06 ENCOUNTER — Telehealth: Payer: Self-pay

## 2011-03-06 NOTE — Telephone Encounter (Signed)

## 2011-03-12 LAB — CEA: CEA: 1.4

## 2011-07-30 ENCOUNTER — Other Ambulatory Visit: Payer: Self-pay | Admitting: Gynecology

## 2011-07-30 DIAGNOSIS — Z853 Personal history of malignant neoplasm of breast: Secondary | ICD-10-CM

## 2011-08-13 ENCOUNTER — Other Ambulatory Visit: Payer: Self-pay | Admitting: Gynecology

## 2011-08-13 ENCOUNTER — Ambulatory Visit
Admission: RE | Admit: 2011-08-13 | Discharge: 2011-08-13 | Disposition: A | Discharge status: Home / Self Care | Payer: PRIVATE HEALTH INSURANCE | Source: Ambulatory Visit | Attending: Gynecology | Admitting: Gynecology

## 2011-08-13 DIAGNOSIS — Z853 Personal history of malignant neoplasm of breast: Secondary | ICD-10-CM

## 2012-02-11 ENCOUNTER — Encounter (HOSPITAL_COMMUNITY): Payer: Medicare Other | Attending: Oncology | Admitting: Oncology

## 2012-02-11 VITALS — BP 118/74 | HR 66 | Temp 97.8°F | Resp 18 | Wt 162.0 lb

## 2012-02-11 DIAGNOSIS — C189 Malignant neoplasm of colon, unspecified: Secondary | ICD-10-CM

## 2012-02-11 DIAGNOSIS — Z85038 Personal history of other malignant neoplasm of large intestine: Secondary | ICD-10-CM | POA: Insufficient documentation

## 2012-02-11 DIAGNOSIS — Z853 Personal history of malignant neoplasm of breast: Secondary | ICD-10-CM | POA: Insufficient documentation

## 2012-02-11 DIAGNOSIS — Z09 Encounter for follow-up examination after completed treatment for conditions other than malignant neoplasm: Secondary | ICD-10-CM | POA: Insufficient documentation

## 2012-02-11 LAB — CEA: CEA: 1.1 ng/mL (ref 0.0–5.0)

## 2012-02-11 NOTE — Progress Notes (Signed)
Problem #1 stage I (T1 C. N0 M0), grade 1 left-sided breast cancer 1.3 cm in size, status post lumpectomy with 2 sentinel nodes negative followed by radiation therapy and 5 years of tamoxifen finishing as of April 2012 with her date of surgery on 08/03/2005. As receptors were 97% positive progesterone separate 98% positive, Ki-67 marker low 6% HER-2/neu was 2+ but negative by fish and no LV I was seen. Problem #2 stage II (T3, N0, M0 grade 2) adenocarcinoma sigmoid colon status post surgery by Dr. Kendrick Ranch on 09/25/2001 with a negative repeat colonoscopy last year. She will have followup with Dr. Jarold Motto 5 years. She did not receive adjuvant chemotherapy but has had normal followups. Her CEA is pending from today.   She is doing well has a negative review of systems from an oncology standpoint. She would like to be release from this clinic. Her vital signs remained stable physical exam shows no adenopathy the left breast is slightly disfigured from the surgery and radiation therapy slightly thickened but without masses. The right breast is without masses. She has no adenopathy in any location. Lungs are clear. Skin shows multiple benign skin lesions. Abdomen is soft and nontender without again in [place]. Heart shows a regular rhythm and rate without murmur rub or gallop. She has no leg or arm edema.  At her request we will release her from this clinic since her colon cancer is most likely not to recur in her followup is critical with Dr. Jarold Motto more than with me. She would like to see just her gynecologist and her primary care physician for her breast cancer followup at this point though I have reassured her that she has an excellent prognosis.

## 2012-02-11 NOTE — Patient Instructions (Addendum)
[facility]  Discharge Instructions Kathryn Powell  DOB Aug 27, 1940 CSN [id number]  MRN [id number] Dr. Glenford Peers  RECOMMENDATIONS MADE BY THE CONSULTANT AND ANY TEST RESULTS WILL BE SENT TO YOUR REFERRING DOCTOR.   EXAM FINDINGS BY MD TODAY AND SIGNS AND SYMPTOMS TO REPORT TO CLINIC OR PRIMARY MD: Discussed per Dr. Mariel Sleet today   INSTRUCTIONS GIVEN AND DISCUSSED: We will do CEA today, this will be the last one needed.  SPECIAL INSTRUCTIONS/FOLLOW-UP: Follow-up with Family Doctor yearly for routine labs Yearly mammograms, and gyn appts Call us if needed for new lumps, bumps, pain, or change in bowel habits   I acknowledge that I have been informed and understand all the instructions given to me and received a copy. I do not have any more questions at this time, but understand that I may call the Specialty Clinic at [facility] at [phone] during business hours should I have any further questions or need assistance in obtaining follow-up care.    __________________________________________  _____________  __________ Signature of Patient or Authorized Representative            Date                   Time    __________________________________________ Nurse's Signature

## 2012-02-12 ENCOUNTER — Telehealth (HOSPITAL_COMMUNITY): Payer: Self-pay

## 2012-02-12 NOTE — Telephone Encounter (Signed)
Message left on patient's voice mail.

## 2012-02-12 NOTE — Telephone Encounter (Signed)
Message copied by Evelena Leyden on Tue Feb 12, 2012  4:25 PM ------      Message from: Mariel Sleet, ERIC S      Created: Tue Feb 12, 2012 10:04 AM       Call her,  cea is still perfect.

## 2012-08-21 ENCOUNTER — Other Ambulatory Visit: Payer: Self-pay | Admitting: Family Medicine

## 2012-08-21 DIAGNOSIS — Z853 Personal history of malignant neoplasm of breast: Secondary | ICD-10-CM

## 2012-09-02 ENCOUNTER — Ambulatory Visit
Admission: RE | Admit: 2012-09-02 | Discharge: 2012-09-02 | Disposition: A | Discharge status: Home / Self Care | Payer: Medicare Other | Source: Ambulatory Visit | Attending: Family Medicine | Admitting: Family Medicine

## 2012-09-02 DIAGNOSIS — Z853 Personal history of malignant neoplasm of breast: Secondary | ICD-10-CM

## 2013-02-25 ENCOUNTER — Encounter: Payer: Self-pay | Admitting: Podiatry

## 2013-05-28 DIAGNOSIS — I2510 Atherosclerotic heart disease of native coronary artery without angina pectoris: Secondary | ICD-10-CM

## 2013-05-28 HISTORY — DX: Atherosclerotic heart disease of native coronary artery without angina pectoris: I25.10

## 2013-09-01 ENCOUNTER — Other Ambulatory Visit: Payer: Self-pay

## 2013-09-01 DIAGNOSIS — Z853 Personal history of malignant neoplasm of breast: Secondary | ICD-10-CM

## 2013-09-01 DIAGNOSIS — Z1231 Encounter for screening mammogram for malignant neoplasm of breast: Secondary | ICD-10-CM

## 2013-09-10 ENCOUNTER — Ambulatory Visit
Admission: RE | Admit: 2013-09-10 | Discharge: 2013-09-10 | Disposition: A | Discharge status: Home / Self Care | Payer: Medicare HMO | Source: Ambulatory Visit

## 2013-09-10 DIAGNOSIS — Z1231 Encounter for screening mammogram for malignant neoplasm of breast: Secondary | ICD-10-CM

## 2013-09-10 DIAGNOSIS — Z853 Personal history of malignant neoplasm of breast: Secondary | ICD-10-CM

## 2014-05-04 DIAGNOSIS — I209 Angina pectoris, unspecified: Secondary | ICD-10-CM | POA: Insufficient documentation

## 2014-05-04 DIAGNOSIS — R9439 Abnormal result of other cardiovascular function study: Secondary | ICD-10-CM | POA: Insufficient documentation

## 2014-05-07 DIAGNOSIS — I2510 Atherosclerotic heart disease of native coronary artery without angina pectoris: Secondary | ICD-10-CM | POA: Insufficient documentation

## 2014-05-07 DIAGNOSIS — Z955 Presence of coronary angioplasty implant and graft: Secondary | ICD-10-CM | POA: Insufficient documentation

## 2014-05-07 HISTORY — PX: CARDIAC VALVE SURGERY: SHX40

## 2014-06-11 ENCOUNTER — Encounter: Payer: Self-pay | Admitting: Gastroenterology

## 2014-06-23 ENCOUNTER — Encounter: Payer: Self-pay | Admitting: Gastroenterology

## 2014-06-23 ENCOUNTER — Ambulatory Visit (INDEPENDENT_AMBULATORY_CARE_PROVIDER_SITE_OTHER): Payer: Medicare HMO | Admitting: Gastroenterology

## 2014-06-23 VITALS — BP 122/78 | HR 73 | Ht 66.0 in | Wt 177.8 lb

## 2014-06-23 DIAGNOSIS — Z85038 Personal history of other malignant neoplasm of large intestine: Secondary | ICD-10-CM | POA: Insufficient documentation

## 2014-06-23 NOTE — Progress Notes (Addendum)
06/23/2014 Kathryn Powell [id number] [birth year]   HISTORY OF PRESENT ILLNESS:  This is a 74 year old female who is previously known to Dr. Sharlett Iles. She has a history of colon cancer about 10 years ago.  She had a colonoscopy in November 2009 at which time the study was normal.  She had another study in October 2012 at which time the study was also normal, but the prep was poor.  For this reason he recommended another colonoscopy at a 3 year interval. She is here today to schedule that procedure. She actually had a cardiac stent placed in December and was placed on Effient, which she needs to continue for one year. She denies absolutely any GI complaints.   Past Medical History  Diagnosis Date  . Hypertension   . Colon cancer   . Thyroid disease    Past Surgical History  Procedure Laterality Date  . Colon surgery    . Vaginal hysterectomy    . Foot surgery      with pins- right  . Breast lumpectomy      left  . Bunionectomy      Bilateral  . Cardiac valve surgery      reports that she has never smoked. She has never used smokeless tobacco. She reports that she does not drink alcohol or use illicit drugs. family history is not on file. Allergies  Allergen Reactions  . Sulfa Antibiotics Rash      Outpatient Encounter Prescriptions as of 06/23/2014  Medication Sig  . aspirin (ASPIRIN EC) 81 MG EC tablet Take 81 mg by mouth daily.    Marland Kitchen atorvastatin (LIPITOR) 40 MG tablet Take 40 mg by mouth.  . calcium carbonate (OS-CAL - DOSED IN MG OF ELEMENTAL CALCIUM) 1250 MG tablet Take 1 tablet by mouth daily. 1000 mg  . cholecalciferol (VITAMIN D) 1000 UNITS tablet Take 1,000 Units by mouth daily.    . fish oil-omega-3 fatty acids 1000 MG capsule Take 1 g by mouth daily. Take 1200mg   2 tabs daily  . levothyroxine (SYNTHROID, LEVOTHROID) 100 MCG tablet Take 100 mcg by mouth daily.    . magnesium 30 MG tablet Take 30 mg by mouth daily.   . metoprolol tartrate (LOPRESSOR) 25 MG  tablet Take 25 mg by mouth 2 (two) times daily.    . Multiple Vitamins-Minerals (MULTIVITAMIN,TX-MINERALS) tablet Take 1 tablet by mouth daily.    . Prasugrel HCl (EFFIENT PO) Take by mouth.  . triamterene-hydrochlorothiazide (DYAZIDE) 37.5-25 MG per capsule Take 1 capsule by mouth every morning.       REVIEW OF SYSTEMS  : All other systems reviewed and negative except where noted in the History of Present Illness.   PHYSICAL EXAM: BP 122/78 mmHg  Pulse 73  Ht 5\' 6"  (1.676 m)  Wt 177 lb 12.8 oz (80.65 kg)  BMI 28.71 kg/m2  SpO2 99% General: Well developed white female in no acute distress Head: Normocephalic and atraumatic Eyes:  Sclerae anicteric, conjunctiva pink. Ears: Normal auditory acuity Lungs: Clear throughout to auscultation Heart: Regular rate and rhythm Abdomen: Soft, non-distended.  Normal bowel sounds.  Previous laparotomy scar noted.  Non-tender. Musculoskeletal: Symmetrical with no gross deformities  Skin: No lesions on visible extremities Extremities: No edema  Neurological: Alert oriented x 4, grossly non-focal Psychological:  Alert and cooperative. Normal mood and affect  ASSESSMENT AND PLAN: -Previous history of colon cancer about 10 years ago:  Was placed in for a colonoscopy recall at 3 year interval due  to poor prep on last colonoscopy in 02/2011.  She was recently placed on Effient in December and needs to continue that for 1 year.  I discussed with Dr. Hilarie Fredrickson and with that patient; decision was made to bring her back in 1 year (05/2015) when she is off of the Effient to have her next colonoscopy since she does not have any GI symptoms.  She is agreeable, but was told to call back if she develops any bleeding, unusual change in bowel habits, etc.   Addendum: Reviewed and agree with initial management. Last 2 colonoscopies have revealed no polyps, with most recent being 2012. Jerene Bears, MD

## 2014-06-23 NOTE — Patient Instructions (Signed)
You have been put on our recall list for a colonoscopy for 05/2015, you will receive a letter at that time to schedule. CC:  Matthias Hughs MD

## 2014-08-24 ENCOUNTER — Other Ambulatory Visit (HOSPITAL_COMMUNITY): Payer: Self-pay | Admitting: Family Medicine

## 2014-08-24 DIAGNOSIS — Z1231 Encounter for screening mammogram for malignant neoplasm of breast: Secondary | ICD-10-CM

## 2014-09-15 ENCOUNTER — Ambulatory Visit (HOSPITAL_COMMUNITY)
Admission: RE | Admit: 2014-09-15 | Discharge: 2014-09-15 | Disposition: A | Discharge status: Home / Self Care | Payer: Medicare HMO | Source: Ambulatory Visit | Attending: Family Medicine | Admitting: Family Medicine

## 2014-09-15 DIAGNOSIS — Z1231 Encounter for screening mammogram for malignant neoplasm of breast: Secondary | ICD-10-CM | POA: Diagnosis present

## 2014-09-17 ENCOUNTER — Encounter: Payer: Self-pay | Admitting: Gastroenterology

## 2015-06-10 ENCOUNTER — Encounter: Payer: Self-pay | Admitting: Internal Medicine

## 2015-07-13 ENCOUNTER — Ambulatory Visit (AMBULATORY_SURGERY_CENTER): Payer: Self-pay | Admitting: *Deleted

## 2015-07-13 VITALS — Ht 67.0 in | Wt 177.0 lb

## 2015-07-13 DIAGNOSIS — Z85038 Personal history of other malignant neoplasm of large intestine: Secondary | ICD-10-CM

## 2015-07-13 MED ORDER — NA SULFATE-K SULFATE-MG SULF 17.5-3.13-1.6 GM/177ML PO SOLN: ORAL | Status: DC

## 2015-07-13 NOTE — Progress Notes (Signed)
Patient denies any allergies to eggs or soy. Patient denies any problems with anesthesia/sedation. Patient denies any oxygen use at home and does not take any diet/weight loss medications. Patient declined EMMI education today,no computer. 2 day prep given to pt. Suprep & MIralax/Dulcolax.

## 2015-07-27 ENCOUNTER — Encounter: Payer: Self-pay | Admitting: Internal Medicine

## 2015-07-27 ENCOUNTER — Ambulatory Visit (AMBULATORY_SURGERY_CENTER): Payer: Medicare HMO | Admitting: Internal Medicine

## 2015-07-27 VITALS — BP 139/70 | HR 64 | Temp 97.8°F | Resp 11 | Ht 67.0 in | Wt 177.0 lb

## 2015-07-27 DIAGNOSIS — Z85038 Personal history of other malignant neoplasm of large intestine: Secondary | ICD-10-CM

## 2015-07-27 MED ORDER — SODIUM CHLORIDE 0.9 % IV SOLN: 500.0000 mL | INTRAVENOUS | Status: DC

## 2015-07-27 NOTE — Patient Instructions (Signed)
Repeat colonoscopy in 5 years. Resume current medications.  Thank you!!   YOU HAD AN ENDOSCOPIC PROCEDURE TODAY AT [facility]:   Refer to the procedure report that was given to you for any specific questions about what was found during the examination.  If the procedure report does not answer your questions, please call your gastroenterologist to clarify.  If you requested that your care partner not be given the details of your procedure findings, then the procedure report has been included in a sealed envelope for you to review at your convenience later.  YOU SHOULD EXPECT: Some feelings of bloating in the abdomen. Passage of more gas than usual.  Walking can help get rid of the air that was put into your GI tract during the procedure and reduce the bloating. If you had a lower endoscopy (such as a colonoscopy or flexible sigmoidoscopy) you may notice spotting of blood in your stool or on the toilet paper. If you underwent a bowel prep for your procedure, you may not have a normal bowel movement for a few days.  Please Note:  You might notice some irritation and congestion in your nose or some drainage.  This is from the oxygen used during your procedure.  There is no need for concern and it should clear up in a day or so.  SYMPTOMS TO REPORT IMMEDIATELY:   Following lower endoscopy (colonoscopy or flexible sigmoidoscopy):  Excessive amounts of blood in the stool  Significant tenderness or worsening of abdominal pains  Swelling of the abdomen that is new, acute  Fever of 100F or higher   For urgent or emergent issues, a gastroenterologist can be reached at any hour by calling [phone].   DIET: Your first meal following the procedure should be a small meal and then it is ok to progress to your normal diet. Heavy or fried foods are harder to digest and may make you feel nauseous or bloated.  Likewise, meals heavy in dairy and vegetables can increase bloating.  Drink  plenty of fluids but you should avoid alcoholic beverages for 24 hours.  ACTIVITY:  You should plan to take it easy for the rest of today and you should NOT DRIVE or use heavy machinery until tomorrow (because of the sedation medicines used during the test).    FOLLOW UP: Our staff will call the number listed on your records the next business day following your procedure to check on you and address any questions or concerns that you may have regarding the information given to you following your procedure. If we do not reach you, we will leave a message.  However, if you are feeling well and you are not experiencing any problems, there is no need to return our call.  We will assume that you have returned to your regular daily activities without incident.  If any biopsies were taken you will be contacted by phone or by letter within the next 1-3 weeks.  Please call us at [phone] if you have not heard about the biopsies in 3 weeks.    SIGNATURES/CONFIDENTIALITY: You and/or your care partner have signed paperwork which will be entered into your electronic medical record.  These signatures attest to the fact that that the information above on your After Visit Summary has been reviewed and is understood.  Full responsibility of the confidentiality of this discharge information lies with you and/or your care-partner.

## 2015-07-27 NOTE — Progress Notes (Signed)
Report to PACU, RN, vss, BBS= Clear.  

## 2015-07-27 NOTE — Op Note (Signed)
[facility]  [address] [place], [id number]   COLONOSCOPY PROCEDURE REPORT  PATIENT: Kathryn Powell, Kathryn Powell  MR#: [id number] BIRTHDATE: October 29, 1940 , 74  yrs. old GENDER: female ENDOSCOPIST: Jerene Bears, MD REFERRED [id number] R Patterson, M.D. PROCEDURE DATE:  07/27/2015 PROCEDURE:   Colonoscopy, surveillance First Screening Colonoscopy - Avg.  risk and is 50 yrs.  old or older - No.  Prior Negative Screening - Now for repeat screening. N/A  History of Adenoma - Now for follow-up colonoscopy & has been > or = to 3 yrs.  Yes hx of adenoma.  Has been 3 or more years since last colonoscopy.  Polyps removed today? No Recommend repeat exam, <10 yrs? Yes high risk Polyps removed today? 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 MEDICATIONS: Monitored anesthesia care and Propofol 200 mg IV  DESCRIPTION OF PROCEDURE:   After the risks benefits and alternatives of the procedure were thoroughly explained, informed consent was obtained.  The digital rectal exam revealed no rectal mass.   The LB [id number]  endoscope was introduced through the anus and advanced to the cecum, which was identified by both the appendix and ileocecal valve. No adverse events experienced. The quality of the prep was good.  (2 day Suprep was used)  The instrument was then slowly withdrawn as the colon was fully examined. Estimated blood loss is zero unless otherwise noted in this procedure report.      COLON FINDINGS: There was evidence of a normal appearing prior surgical anastomosis in the sigmoid colon.   The examination was otherwise normal.  No polyps or cancer seen.  Retroflexed views revealed internal hemorrhoids. The time to cecum = 4.4  Withdrawal time =  12.7    The scope was withdrawn and the procedure completed.  COMPLICATIONS: There were no immediate complications.  ENDOSCOPIC IMPRESSION: 1.    There was evidence of prior surgical anastomosis in the sigmoid colon 2.   The examination was otherwise normal  RECOMMENDATIONS: Repeat Colonoscopy in 5 years.  eSigned:  Jerene Bears, MD 07/27/2015 9:26 AM   cc:  the patient, PCP

## 2015-07-28 ENCOUNTER — Telehealth: Payer: Self-pay | Admitting: *Deleted

## 2015-07-28 NOTE — Telephone Encounter (Signed)
  Follow up Call-  Call back number 07/27/2015  Post procedure Call Back phone  # [phone]  Permission to leave phone message Yes     Patient questions:  Do you have a fever, pain , or abdominal swelling? No. Pain Score  0 *  Have you tolerated food without any problems? Yes.    Have you been able to return to your normal activities? Yes.    Do you have any questions about your discharge instructions: Diet   No. Medications  No. Follow up visit  No.  Do you have questions or concerns about your Care? No.  Actions: * If pain score is 4 or above: No action needed, pain <4.

## 2015-08-08 ENCOUNTER — Other Ambulatory Visit (HOSPITAL_COMMUNITY): Payer: Self-pay | Admitting: Family Medicine

## 2015-08-08 ENCOUNTER — Other Ambulatory Visit: Payer: Self-pay

## 2015-08-08 DIAGNOSIS — Z1231 Encounter for screening mammogram for malignant neoplasm of breast: Secondary | ICD-10-CM

## 2015-09-19 ENCOUNTER — Ambulatory Visit (HOSPITAL_COMMUNITY)
Admission: RE | Admit: 2015-09-19 | Discharge: 2015-09-19 | Disposition: A | Discharge status: Home / Self Care | Payer: Medicare HMO | Source: Ambulatory Visit | Attending: Family Medicine | Admitting: Family Medicine

## 2015-09-19 DIAGNOSIS — Z1231 Encounter for screening mammogram for malignant neoplasm of breast: Secondary | ICD-10-CM | POA: Diagnosis present

## 2016-01-18 ENCOUNTER — Other Ambulatory Visit: Payer: Self-pay | Admitting: Orthopedic Surgery

## 2016-01-31 ENCOUNTER — Encounter (HOSPITAL_BASED_OUTPATIENT_CLINIC_OR_DEPARTMENT_OTHER): Payer: Self-pay | Admitting: *Deleted

## 2016-02-01 ENCOUNTER — Encounter (HOSPITAL_COMMUNITY)
Admission: RE | Admit: 2016-02-01 | Discharge: 2016-02-01 | Disposition: A | Discharge status: Home / Self Care | Payer: Medicare HMO | Source: Ambulatory Visit | Attending: Orthopedic Surgery | Admitting: Orthopedic Surgery

## 2016-02-01 DIAGNOSIS — Z955 Presence of coronary angioplasty implant and graft: Secondary | ICD-10-CM | POA: Diagnosis not present

## 2016-02-01 DIAGNOSIS — Z7982 Long term (current) use of aspirin: Secondary | ICD-10-CM | POA: Diagnosis not present

## 2016-02-01 DIAGNOSIS — I2510 Atherosclerotic heart disease of native coronary artery without angina pectoris: Secondary | ICD-10-CM | POA: Diagnosis not present

## 2016-02-01 DIAGNOSIS — I10 Essential (primary) hypertension: Secondary | ICD-10-CM | POA: Diagnosis not present

## 2016-02-01 DIAGNOSIS — Z85038 Personal history of other malignant neoplasm of large intestine: Secondary | ICD-10-CM | POA: Diagnosis not present

## 2016-02-01 DIAGNOSIS — E039 Hypothyroidism, unspecified: Secondary | ICD-10-CM | POA: Diagnosis not present

## 2016-02-01 DIAGNOSIS — M7742 Metatarsalgia, left foot: Secondary | ICD-10-CM | POA: Diagnosis not present

## 2016-02-01 DIAGNOSIS — Z9049 Acquired absence of other specified parts of digestive tract: Secondary | ICD-10-CM | POA: Diagnosis not present

## 2016-02-01 DIAGNOSIS — M2042 Other hammer toe(s) (acquired), left foot: Secondary | ICD-10-CM | POA: Diagnosis not present

## 2016-02-01 DIAGNOSIS — M2022 Hallux rigidus, left foot: Secondary | ICD-10-CM | POA: Diagnosis not present

## 2016-02-01 LAB — BASIC METABOLIC PANEL
Anion gap: 8 (ref 5–15)
BUN: 17 mg/dL (ref 6–20)
CALCIUM: 8.8 mg/dL — AB (ref 8.9–10.3)
CO2: 29 mmol/L (ref 22–32)
CREATININE: 0.73 mg/dL (ref 0.44–1.00)
Chloride: 98 mmol/L — ABNORMAL LOW (ref 101–111)
GFR calc Af Amer: >60 mL/min (ref >60)
GFR calc non Af Amer: >60 mL/min (ref >60)
GLUCOSE: 98 mg/dL (ref 65–99)
Potassium: 3.9 mmol/L (ref 3.5–5.1)
Sodium: 135 mmol/L (ref 135–145)

## 2016-02-01 NOTE — Anesthesia Preprocedure Evaluation (Addendum)
Anesthesia Evaluation  Patient identified by MRN, date of birth, ID band Patient awake    Reviewed: Allergy & Precautions, NPO status , Patient's Chart, lab work & pertinent test results, reviewed documented beta blocker date and time   History of Anesthesia Complications Negative for: history of anesthetic complications  Airway Mallampati: III  TM Distance: >3 FB Neck ROM: Full    Dental  (+) Teeth Intact, Dental Advisory Given   Pulmonary neg pulmonary ROS,    Pulmonary exam normal breath sounds clear to auscultation       Cardiovascular hypertension, Pt. on medications and Pt. on home beta blockers (-) angina+ CAD and + Cardiac Stents (DES to LAD in 2015)  (-) CABG  Rhythm:Regular Rate:Normal  LHC 05/07/2014: Conclusions: 1. Single vessel obstructive coronary artery disease 2. PCI to the mid LAD with a 3.0 x 23 mm Alpine drug-eluting stent. Lesion length was 18 mm mild calcium no thrombus. Lesion went from 90% pre-to 0% post residual. Pre-TIMI flow is 3, post TIMI flow is 3 3. Right radial access.   Neuro/Psych negative neurological ROS     GI/Hepatic Neg liver ROS, neg GERD  ,H/o colon cancer   Endo/Other  neg diabetesHypothyroidism   Renal/GU negative Renal ROS     Musculoskeletal   Abdominal   Peds  Hematology   Anesthesia Other Findings   Reproductive/Obstetrics                            Anesthesia Physical Anesthesia Plan  ASA: III  Anesthesia Plan: General   Post-op Pain Management:    Induction: Intravenous  Airway Management Planned: LMA  Additional Equipment:   Intra-op Plan:   Post-operative Plan: Extubation in OR  Informed Consent: I have reviewed the patients History and Physical, chart, labs and discussed the procedure including the risks, benefits and alternatives for the proposed anesthesia with the patient or authorized representative who has indicated  his/her understanding and acceptance.   Dental advisory given  Plan Discussed with:   Anesthesia Plan Comments: (Risks of general anesthesia discussed including, but not limited to, sore throat, hoarse voice, chipped/damaged teeth, injury to vocal cords, nausea and vomiting, allergic reactions, lung infection, heart attack, stroke, and death. All questions answered. )       Anesthesia Quick Evaluation

## 2016-02-02 ENCOUNTER — Encounter (HOSPITAL_BASED_OUTPATIENT_CLINIC_OR_DEPARTMENT_OTHER): Payer: Self-pay | Admitting: Certified Registered"

## 2016-02-02 ENCOUNTER — Encounter (HOSPITAL_BASED_OUTPATIENT_CLINIC_OR_DEPARTMENT_OTHER)
Admission: RE | Disposition: A | Discharge status: Home / Self Care | Payer: Self-pay | Source: Ambulatory Visit | Attending: Orthopedic Surgery

## 2016-02-02 ENCOUNTER — Ambulatory Visit (HOSPITAL_BASED_OUTPATIENT_CLINIC_OR_DEPARTMENT_OTHER)
Admission: RE | Admit: 2016-02-02 | Discharge: 2016-02-02 | Disposition: A | Discharge status: Home / Self Care | Payer: Medicare HMO | Source: Ambulatory Visit | Attending: Orthopedic Surgery | Admitting: Orthopedic Surgery

## 2016-02-02 ENCOUNTER — Ambulatory Visit (HOSPITAL_BASED_OUTPATIENT_CLINIC_OR_DEPARTMENT_OTHER): Payer: Medicare HMO | Admitting: Anesthesiology

## 2016-02-02 DIAGNOSIS — M2022 Hallux rigidus, left foot: Secondary | ICD-10-CM | POA: Diagnosis not present

## 2016-02-02 DIAGNOSIS — I10 Essential (primary) hypertension: Secondary | ICD-10-CM | POA: Diagnosis not present

## 2016-02-02 DIAGNOSIS — I2510 Atherosclerotic heart disease of native coronary artery without angina pectoris: Secondary | ICD-10-CM | POA: Insufficient documentation

## 2016-02-02 DIAGNOSIS — C189 Malignant neoplasm of colon, unspecified: Secondary | ICD-10-CM

## 2016-02-02 DIAGNOSIS — M2042 Other hammer toe(s) (acquired), left foot: Secondary | ICD-10-CM | POA: Diagnosis not present

## 2016-02-02 DIAGNOSIS — E039 Hypothyroidism, unspecified: Secondary | ICD-10-CM | POA: Insufficient documentation

## 2016-02-02 DIAGNOSIS — Z9049 Acquired absence of other specified parts of digestive tract: Secondary | ICD-10-CM | POA: Insufficient documentation

## 2016-02-02 DIAGNOSIS — Z7982 Long term (current) use of aspirin: Secondary | ICD-10-CM | POA: Insufficient documentation

## 2016-02-02 DIAGNOSIS — Z955 Presence of coronary angioplasty implant and graft: Secondary | ICD-10-CM | POA: Insufficient documentation

## 2016-02-02 DIAGNOSIS — Z85038 Personal history of other malignant neoplasm of large intestine: Secondary | ICD-10-CM | POA: Insufficient documentation

## 2016-02-02 DIAGNOSIS — M7742 Metatarsalgia, left foot: Secondary | ICD-10-CM | POA: Insufficient documentation

## 2016-02-02 HISTORY — DX: Atherosclerotic heart disease of native coronary artery without angina pectoris: I25.10

## 2016-02-02 HISTORY — PX: HAMMER TOE SURGERY: SHX385

## 2016-02-02 HISTORY — PX: METATARSAL OSTEOTOMY: SHX1641

## 2016-02-02 HISTORY — DX: Hypothyroidism, unspecified: E03.9

## 2016-02-02 HISTORY — PX: ARTHRODESIS METATARSALPHALANGEAL JOINT (MTPJ): SHX6566

## 2016-02-02 SURGERY — FUSION, JOINT, GREAT TOE
Anesthesia: General | Site: Foot | Laterality: Left

## 2016-02-02 MED ORDER — FENTANYL CITRATE (PF) 100 MCG/2ML IJ SOLN
INTRAMUSCULAR | Status: AC
  Filled 2016-02-02: qty 2

## 2016-02-02 MED ORDER — MIDAZOLAM HCL 2 MG/2ML IJ SOLN
INTRAMUSCULAR | Status: AC
  Filled 2016-02-02: qty 2

## 2016-02-02 MED ORDER — BUPIVACAINE-EPINEPHRINE 0.5% -1:200000 IJ SOLN
INTRAMUSCULAR | Status: DC | PRN
  Administered 2016-02-02: 15 mL

## 2016-02-02 MED ORDER — CHLORHEXIDINE GLUCONATE 4 % EX LIQD: 60.0000 mL | Freq: Once | CUTANEOUS | Status: DC

## 2016-02-02 MED ORDER — 0.9 % SODIUM CHLORIDE (POUR BTL) OPTIME
TOPICAL | Status: DC | PRN
  Administered 2016-02-02: 300 mL

## 2016-02-02 MED ORDER — PROPOFOL 10 MG/ML IV BOLUS
INTRAVENOUS | Status: DC | PRN
  Administered 2016-02-02: 100 mg via INTRAVENOUS

## 2016-02-02 MED ORDER — ONDANSETRON HCL 4 MG/2ML IJ SOLN: 4.0000 mg | Freq: Once | INTRAMUSCULAR | Status: DC | PRN

## 2016-02-02 MED ORDER — SODIUM CHLORIDE 0.9 % IV SOLN: INTRAVENOUS | Status: DC

## 2016-02-02 MED ORDER — ONDANSETRON HCL 4 MG/2ML IJ SOLN
INTRAMUSCULAR | Status: DC | PRN
  Administered 2016-02-02: 4 mg via INTRAVENOUS

## 2016-02-02 MED ORDER — CEFAZOLIN SODIUM-DEXTROSE 2-4 GM/100ML-% IV SOLN
2.0000 g | INTRAVENOUS | Status: AC
  Administered 2016-02-02: 2 g via INTRAVENOUS

## 2016-02-02 MED ORDER — OXYCODONE HCL 5 MG PO TABS: 5.0000 mg | ORAL_TABLET | ORAL | 0 refills | Status: DC | PRN

## 2016-02-02 MED ORDER — FENTANYL CITRATE (PF) 100 MCG/2ML IJ SOLN: 25.0000 ug | INTRAMUSCULAR | Status: DC | PRN

## 2016-02-02 MED ORDER — LIDOCAINE 2% (20 MG/ML) 5 ML SYRINGE
INTRAMUSCULAR | Status: AC
  Filled 2016-02-02: qty 5

## 2016-02-02 MED ORDER — ASPIRIN 81 MG PO TBEC: 81.0000 mg | DELAYED_RELEASE_TABLET | Freq: Two times a day (BID) | ORAL | 12 refills | Status: DC

## 2016-02-02 MED ORDER — LIDOCAINE HCL (CARDIAC) 20 MG/ML IV SOLN
INTRAVENOUS | Status: DC | PRN
  Administered 2016-02-02: 60 mg via INTRAVENOUS

## 2016-02-02 MED ORDER — GLYCOPYRROLATE 0.2 MG/ML IJ SOLN: 0.2000 mg | Freq: Once | INTRAMUSCULAR | Status: DC | PRN

## 2016-02-02 MED ORDER — CEFAZOLIN SODIUM-DEXTROSE 2-4 GM/100ML-% IV SOLN
INTRAVENOUS | Status: AC
  Filled 2016-02-02: qty 100

## 2016-02-02 MED ORDER — FENTANYL CITRATE (PF) 100 MCG/2ML IJ SOLN
50.0000 ug | INTRAMUSCULAR | Status: DC | PRN
  Administered 2016-02-02 (×2): 50 ug via INTRAVENOUS

## 2016-02-02 MED ORDER — CEFAZOLIN SODIUM-DEXTROSE 2-4 GM/100ML-% IV SOLN: 2.0000 g | INTRAVENOUS | Status: DC

## 2016-02-02 MED ORDER — LACTATED RINGERS IV SOLN
INTRAVENOUS | Status: DC
  Administered 2016-02-02 (×2): via INTRAVENOUS

## 2016-02-02 MED ORDER — SCOPOLAMINE 1 MG/3DAYS TD PT72: 1.0000 | MEDICATED_PATCH | Freq: Once | TRANSDERMAL | Status: DC | PRN

## 2016-02-02 MED ORDER — DEXAMETHASONE SODIUM PHOSPHATE 10 MG/ML IJ SOLN
INTRAMUSCULAR | Status: DC | PRN
  Administered 2016-02-02: 10 mg via INTRAVENOUS

## 2016-02-02 MED ORDER — MIDAZOLAM HCL 2 MG/2ML IJ SOLN: 1.0000 mg | INTRAMUSCULAR | Status: DC | PRN

## 2016-02-02 MED ORDER — SODIUM CHLORIDE 0.9 % IV SOLN
INTRAVENOUS | Status: DC
Start: 2016-02-02 — End: 2016-02-02

## 2016-02-02 SURGICAL SUPPLY — 93 items
APL SKNCLS STERI-STRIP NONHPOA (GAUZE/BANDAGES/DRESSINGS)
BANDAGE ACE 4X5 VEL STRL LF (GAUZE/BANDAGES/DRESSINGS) IMPLANT
BANDAGE ESMARK 6X9 LF (GAUZE/BANDAGES/DRESSINGS) ×2 IMPLANT
BENZOIN TINCTURE PRP APPL 2/3 (GAUZE/BANDAGES/DRESSINGS) IMPLANT
BIT DRILL 1.8 CANN MAX VPC (BIT) ×2 IMPLANT
BIT DRILL 2.0 (BIT) ×3
BIT DRILL 2XNS DISP SS SM FRAG (BIT) ×1 IMPLANT
BIT DRL 2XNS DISP SS SM FRAG (BIT) ×2
BLADE AVERAGE 25X9 (BLADE) ×3 IMPLANT
BLADE MICRO SAGITTAL (BLADE) IMPLANT
BLADE OSC/SAG .038X5.5 CUT EDG (BLADE) ×3 IMPLANT
BLADE SURG 15 STRL LF DISP TIS (BLADE) ×4 IMPLANT
BLADE SURG 15 STRL SS (BLADE) ×6
BNDG CMPR 9X4 STRL LF SNTH (GAUZE/BANDAGES/DRESSINGS)
BNDG CMPR 9X6 STRL LF SNTH (GAUZE/BANDAGES/DRESSINGS) ×2
BNDG COHESIVE 4X5 TAN STRL (GAUZE/BANDAGES/DRESSINGS) ×3 IMPLANT
BNDG COHESIVE 6X5 TAN STRL LF (GAUZE/BANDAGES/DRESSINGS) ×3 IMPLANT
BNDG CONFORM 2 STRL LF (GAUZE/BANDAGES/DRESSINGS) IMPLANT
BNDG CONFORM 3 STRL LF (GAUZE/BANDAGES/DRESSINGS) ×1 IMPLANT
BNDG ESMARK 4X9 LF (GAUZE/BANDAGES/DRESSINGS) IMPLANT
BNDG ESMARK 6X9 LF (GAUZE/BANDAGES/DRESSINGS) ×3
CAP PIN PROTECTOR ORTHO WHT (CAP) IMPLANT
CHLORAPREP W/TINT 26ML (MISCELLANEOUS) ×3 IMPLANT
COVER BACK TABLE 60X90IN (DRAPES) ×3 IMPLANT
CUFF TOURNIQUET SINGLE 34IN LL (TOURNIQUET CUFF) ×3 IMPLANT
DECANTER SPIKE VIAL GLASS SM (MISCELLANEOUS) IMPLANT
DRAPE EXTREMITY T 121X128X90 (DRAPE) ×3 IMPLANT
DRAPE OEC MINIVIEW 54X84 (DRAPES) ×3 IMPLANT
DRAPE SURG 17X23 STRL (DRAPES) IMPLANT
DRAPE U-SHAPE 47X51 STRL (DRAPES) ×3 IMPLANT
DRSG MEPITEL 4X7.2 (GAUZE/BANDAGES/DRESSINGS) ×3 IMPLANT
DRSG PAD ABDOMINAL 8X10 ST (GAUZE/BANDAGES/DRESSINGS) ×6 IMPLANT
ELECT REM PT RETURN 9FT ADLT (ELECTROSURGICAL) ×3
ELECTRODE REM PT RTRN 9FT ADLT (ELECTROSURGICAL) ×2 IMPLANT
GAUZE SPONGE 4X4 12PLY STRL (GAUZE/BANDAGES/DRESSINGS) ×3 IMPLANT
GLOVE BIO SURGEON STRL SZ8 (GLOVE) ×3 IMPLANT
GLOVE BIOGEL PI IND STRL 7.0 (GLOVE) ×1 IMPLANT
GLOVE BIOGEL PI IND STRL 8 (GLOVE) ×4 IMPLANT
GLOVE BIOGEL PI INDICATOR 7.0 (GLOVE) ×1
GLOVE BIOGEL PI INDICATOR 8 (GLOVE) ×2
GLOVE ECLIPSE 6.5 STRL STRAW (GLOVE) ×4 IMPLANT
GLOVE ECLIPSE 7.5 STRL STRAW (GLOVE) ×3 IMPLANT
GLOVE EXAM NITRILE MD LF STRL (GLOVE) ×2 IMPLANT
GOWN STRL REUS W/ TWL LRG LVL3 (GOWN DISPOSABLE) ×2 IMPLANT
GOWN STRL REUS W/ TWL XL LVL3 (GOWN DISPOSABLE) ×4 IMPLANT
GOWN STRL REUS W/TWL LRG LVL3 (GOWN DISPOSABLE) ×3
GOWN STRL REUS W/TWL XL LVL3 (GOWN DISPOSABLE) ×6
GUIDEWIRE ORTHO 0.054X7 SS (WIRE) IMPLANT
K-WIRE COCR 0.9X95 (WIRE) ×3
K-WIRE DBL END .054 LG (WIRE) IMPLANT
KWIRE COCR 0.9X95 (WIRE) ×1 IMPLANT
NEEDLE HYPO 22GX1.5 SAFETY (NEEDLE) ×2 IMPLANT
NS IRRIG 1000ML POUR BTL (IV SOLUTION) ×3 IMPLANT
PACK BASIN DAY SURGERY FS (CUSTOM PROCEDURE TRAY) ×3 IMPLANT
PAD CAST 4YDX4 CTTN HI CHSV (CAST SUPPLIES) ×2 IMPLANT
PADDING CAST ABS 4INX4YD NS (CAST SUPPLIES)
PADDING CAST ABS COTTON 4X4 ST (CAST SUPPLIES) IMPLANT
PADDING CAST COTTON 4X4 STRL (CAST SUPPLIES) ×3
PADDING CAST COTTON 6X4 STRL (CAST SUPPLIES) ×3 IMPLANT
PENCIL BUTTON HOLSTER BLD 10FT (ELECTRODE) ×3 IMPLANT
PLATE SM 1/4 TUBULAR 5H (Plate) ×2 IMPLANT
SANITIZER HAND PURELL 535ML FO (MISCELLANEOUS) ×3 IMPLANT
SCREW 2.5X30MM VPC (Wire) ×2 IMPLANT
SCREW CORTICAL 2.7MM  18MM (Screw) ×2 IMPLANT
SCREW CORTICAL 2.7MM  20MM (Screw) ×1 IMPLANT
SCREW CORTICAL 2.7MM 16MM (Screw) ×2 IMPLANT
SCREW CORTICAL 2.7MM 18MM (Screw) ×2 IMPLANT
SCREW CORTICAL 2.7MM 20MM (Screw) ×1 IMPLANT
SCREW HCS TWIST-OFF 2.0X12MM (Screw) ×2 IMPLANT
SCREW HCS TWIST-OFF 2.0X14MM (Screw) ×2 IMPLANT
SHEET MEDIUM DRAPE 40X70 STRL (DRAPES) ×5 IMPLANT
SLEEVE SCD COMPRESS KNEE MED (MISCELLANEOUS) ×3 IMPLANT
SPLINT FAST PLASTER 5X30 (CAST SUPPLIES) ×20
SPLINT PLASTER CAST FAST 5X30 (CAST SUPPLIES) ×40 IMPLANT
SPONGE LAP 18X18 X RAY DECT (DISPOSABLE) ×3 IMPLANT
SPONGE SURGIFOAM ABS GEL 12-7 (HEMOSTASIS) IMPLANT
STOCKINETTE 6  STRL (DRAPES) ×1
STOCKINETTE 6 STRL (DRAPES) ×2 IMPLANT
STRIP CLOSURE SKIN 1/2X4 (GAUZE/BANDAGES/DRESSINGS) IMPLANT
SUCTION FRAZIER HANDLE 10FR (MISCELLANEOUS) ×1
SUCTION TUBE FRAZIER 10FR DISP (MISCELLANEOUS) ×2 IMPLANT
SUT ETHILON 3 0 PS 1 (SUTURE) ×5 IMPLANT
SUT MNCRL AB 3-0 PS2 18 (SUTURE) ×3 IMPLANT
SUT VIC AB 0 SH 27 (SUTURE) IMPLANT
SUT VIC AB 2-0 SH 27 (SUTURE)
SUT VIC AB 2-0 SH 27XBRD (SUTURE) IMPLANT
SUT VICRYL 0 UR6 27IN ABS (SUTURE) IMPLANT
SYR BULB 3OZ (MISCELLANEOUS) ×3 IMPLANT
SYR CONTROL 10ML LL (SYRINGE) ×2 IMPLANT
TOWEL OR 17X24 6PK STRL BLUE (TOWEL DISPOSABLE) ×6 IMPLANT
TUBE CONNECTING 20X1/4 (TUBING) ×3 IMPLANT
UNDERPAD 30X30 (UNDERPADS AND DIAPERS) ×3 IMPLANT
YANKAUER SUCT BULB TIP NO VENT (SUCTIONS) IMPLANT

## 2016-02-02 NOTE — Anesthesia Postprocedure Evaluation (Signed)
Anesthesia Post Note  Patient: Kathryn Powell  Procedure(s) Performed: Procedure(s) (LRB): ARTHRODESIS LEFT METATARSALPHALANGEAL JOINT (MTPJ) (Left) 2 - 3 WEIL METATARSAL OSTEOTOMY (Left) LEFT SECOND MALLET TOE CORRECTION (Left)  Patient location during evaluation: PACU Anesthesia Type: General Level of consciousness: awake and alert Pain management: pain level controlled Vital Signs Assessment: post-procedure vital signs reviewed and stable Respiratory status: spontaneous breathing, nonlabored ventilation and respiratory function stable Cardiovascular status: blood pressure returned to baseline and stable Postop Assessment: no signs of nausea or vomiting Anesthetic complications: no    Last Vitals:  Vitals:   02/02/16 1308 02/02/16 1315  BP:  138/70  Pulse: 70 71  Resp: 14 13  Temp:      Last Pain:  Vitals:   02/02/16 0957  TempSrc: Oral                 Nilda Simmer

## 2016-02-02 NOTE — Brief Op Note (Signed)
02/02/2016  12:28 PM  PATIENT:  Gracy Bruins  75 y.o. female  PRE-OPERATIVE DIAGNOSIS: 1.  Left hallux rigidus      2.  Retained hardware left hallux      3.  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  Left hallux MPJ arthrodesis 2.  Left 2nd MT Weil osteotomy (separate incision) 3.  Left 3rd MT Weil osteotomy (separate incision)  4.  Left 2nd hammertoe correction (separate incision) 5.  Percutaneous FDL tenotomy at 2nd toe (separate incision) 6.  Left foot AP and lateral xrays  SURGEON:  Wylene Simmer, MD  ASSISTANT: Mechele Claude, PA-C  ANESTHESIA:   General  EBL:  minimal   TOURNIQUET:   Total Tourniquet Time Documented: Thigh (Left) - 59 minutes Total: Thigh (Left) - 59 minutes  COMPLICATIONS:  None apparent  DISPOSITION:  Extubated, awake and stable to recovery.  DICTATION ID:

## 2016-02-02 NOTE — H&P (Signed)
Kathryn Powell is an 75 y.o. female.   Chief Complaint:  Left forefoot pain HPI: 75 y/o female with a long h/o left foot pain presents today for left forefoot reconstruction.  She has failed non op treatment including activity modification, shoewear modification and oral NSAIDs.   Past Medical History:  Diagnosis Date  . Colon cancer ([place]) over 10 years   s/p colon resection  . Coronary artery disease 2015   stent  . Hypertension   . Hypothyroidism   . Thyroid disease     Past Surgical History:  Procedure Laterality Date  . BREAST LUMPECTOMY     left  . BUNIONECTOMY     Bilateral  . CARDIAC VALVE SURGERY  Dec. 11,2015   heart stent placed per pt. Rosine Door  . COLON SURGERY  over 10 years ago  . FOOT SURGERY     with pins- right  . VAGINAL HYSTERECTOMY      Family History  Problem Relation Age of Onset  . Hypertension Mother   . Hypertension Brother   . Colon cancer Neg Hx    Social History:  reports that she has never smoked. She has never used smokeless tobacco. She reports that she drinks alcohol. She reports that she does not use drugs.  Allergies:  Allergies  Allergen Reactions  . Lisinopril Cough  . Sulfa Antibiotics Hives and Rash    Medications Prior to Admission  Medication Sig Dispense Refill  . aspirin (ASPIRIN EC) 81 MG EC tablet Take 81 mg by mouth daily.      Marland Kitchen atorvastatin (LIPITOR) 40 MG tablet Take 1 tablet by mouth daily.    . calcium carbonate (OS-CAL - DOSED IN MG OF ELEMENTAL CALCIUM) 1250 MG tablet Take 1 tablet by mouth daily. 1000 mg    . cholecalciferol (VITAMIN D) 1000 UNITS tablet Take 1,000 Units by mouth daily.      . fish oil-omega-3 fatty acids 1000 MG capsule Take 1 g by mouth daily. Take 1263m  2 tabs daily    . levothyroxine (SYNTHROID, LEVOTHROID) 100 MCG tablet Take 100 mcg by mouth daily.      . magnesium 30 MG tablet Take 30 mg by mouth daily.     . metoprolol tartrate (LOPRESSOR) 25 MG tablet Take 25 mg by mouth 2 (two) times  daily.      . Multiple Vitamins-Minerals (MULTIVITAMIN,TX-MINERALS) tablet Take 1 tablet by mouth daily.      .Marland Kitchentriamterene-hydrochlorothiazide (DYAZIDE) 37.5-25 MG per capsule Take 1 capsule by mouth every morning.        Results for orders placed or performed during the hospital encounter of 02/01/16 (from the past 48 hour(s))  Basic metabolic panel     Status: Abnormal   Collection Time: 02/01/16 11:25 AM  Result Value Ref Range   Sodium 135 135 - 145 mmol/L   Potassium 3.9 3.5 - 5.1 mmol/L   Chloride 98 (L) 101 - 111 mmol/L   CO2 29 22 - 32 mmol/L   Glucose, Bld 98 65 - 99 mg/dL   BUN 17 6 - 20 mg/dL   Creatinine, Ser 0.73 0.44 - 1.00 mg/dL   Calcium 8.8 (L) 8.9 - 10.3 mg/dL   GFR calc non Af Amer >60 >60 mL/min   GFR calc Af Amer >60 >60 mL/min    Comment: (NOTE) The eGFR has been calculated using the CKD EPI equation. This calculation has not been validated in all clinical situations. eGFR's persistently <60 mL/min signify possible Chronic  Kidney Disease.    Anion gap 8 5 - 15   No results found.  ROS  No recent f/c/n/v/wt loss  Blood pressure 134/77, pulse 64, temperature 97.6 F (36.4 C), temperature source Oral, resp. rate 18, height '5\' 7"'  (1.702 m), weight 83.5 kg (184 lb), SpO2 100 %. Physical Exam  wn wd woman in nad.  A and o x 4.  Mood and affect normal.  EOMI.  resp unlabored.  L forefoot with severe mallet toe deformity.  Hallux MPJ with minimal motion and valgus malalignment.  Skin o/w intact.  Sens to LT intact.  5/5 strength in PF and DF of the ankle.  No lympadenopathy.  Assessment/Plan L hallux rigidus, metatarsalgia and 2nd mallet toe deformity. The risks and benefits of the alternative treatment options have been discussed in detail.  The patient wishes to proceed with surgery and specifically understands risks of bleeding, infection, nerve damage, blood clots, need for additional surgery, amputation and death.   Wylene Simmer, MD March 03, 2016, 10:38  AM

## 2016-02-02 NOTE — Discharge Instructions (Addendum)
Wylene Simmer, MD [facility]  Please read the following information regarding your care after surgery.  Medications  You only need a prescription for the narcotic pain medicine (ex. oxycodone, Percocet, Norco).  All of the other medicines listed below are available over the counter. X acetominophen (Tylenol) 650 mg every 4-6 hours as you need for minor pain X oxycodone as prescribed for moderate to severe pain ?   Narcotic pain medicine (ex. oxycodone, Percocet, Vicodin) will cause constipation.  To prevent this problem, take the following medicines while you are taking any pain medicine. X docusate sodium (Colace) 100 mg twice a day X senna (Senokot) 2 tablets twice a day  X To help prevent blood clots, take a baby aspirin (81 mg) twice a day for two weeks after surgery.  You should also get up every hour while you are awake to move around.    Weight Bearing ? Bear weight when you are able on your operated leg or foot. ? Bear weight only on the heel of your operated foot in the post-op shoe. X Do not bear any weight on the operated leg or foot.  Cast / Splint / Dressing X Keep your splint or cast clean and dry.  Dont put anything (coat hanger, pencil, etc) down inside of it.  If it gets damp, use a hair dryer on the cool setting to dry it.  If it gets soaked, call the office to schedule an appointment for a cast change. ? Remove your dressing 3 days after surgery and cover the incisions with dry dressings.    After your dressing, cast or splint is removed; you may shower, but do not soak or scrub the wound.  Allow the water to run over it, and then gently pat it dry.  Swelling It is normal for you to have swelling where you had surgery.  To reduce swelling and pain, keep your toes above your nose for at least 3 days after surgery.  It may be necessary to keep your foot or leg elevated for several weeks.  If it hurts, it should be elevated.  Follow Up Call my office at  [phone] when you are discharged from the hospital or surgery center to schedule an appointment to be seen two weeks after surgery.  Call my office at [phone] if you develop a fever >101.5 F, nausea, vomiting, bleeding from the surgical site or severe pain.      Post Anesthesia Home Care Instructions  Activity: Get plenty of rest for the remainder of the day. A responsible adult should stay with you for 24 hours following the procedure.  For the next 24 hours, DO NOT: -Drive a car -Paediatric nurse -Drink alcoholic beverages -Take any medication unless instructed by your physician -Make any legal decisions or sign important papers.  Meals: Start with liquid foods such as gelatin or soup. Progress to regular foods as tolerated. Avoid greasy, spicy, heavy foods. If nausea and/or vomiting occur, drink only clear liquids until the nausea and/or vomiting subsides. Call your physician if vomiting continues.  Special Instructions/Symptoms: Your throat may feel dry or sore from the anesthesia or the breathing tube placed in your throat during surgery. If this causes discomfort, gargle with warm salt water. The discomfort should disappear within 24 hours.  If you had a scopolamine patch placed behind your ear for the management of post- operative nausea and/or vomiting:  1. The medication in the patch is effective for 72 hours, after which it should  be removed.  Wrap patch in a tissue and discard in the trash. Wash hands thoroughly with soap and water. 2. You may remove the patch earlier than 72 hours if you experience unpleasant side effects which may include dry mouth, dizziness or visual disturbances. 3. Avoid touching the patch. Wash your hands with soap and water after contact with the patch.

## 2016-02-02 NOTE — Anesthesia Procedure Notes (Signed)
Procedure Name: LMA Insertion Date/Time: 02/02/2016 11:07 AM Performed by: Koraline Phillipson D Pre-anesthesia Checklist: Patient identified, Emergency Drugs available, Suction available and Patient being monitored Patient Re-evaluated:Patient Re-evaluated prior to inductionOxygen Delivery Method: Circle system utilized Preoxygenation: Pre-oxygenation with 100% oxygen Intubation Type: IV induction Ventilation: Mask ventilation without difficulty LMA: LMA inserted LMA Size: 4.0 Number of attempts: 1 Airway Equipment and Method: Bite block Placement Confirmation: positive ETCO2 Tube secured with: Tape Dental Injury: Teeth and Oropharynx as per pre-operative assessment

## 2016-02-02 NOTE — Transfer of Care (Signed)
Immediate Anesthesia Transfer of Care Note  Patient: Kathryn Powell  Procedure(s) Performed: Procedure(s): ARTHRODESIS LEFT METATARSALPHALANGEAL JOINT (MTPJ) (Left) 2 - 3 WEIL METATARSAL OSTEOTOMY (Left) LEFT SECOND MALLET TOE CORRECTION (Left)  Patient Location: PACU  Anesthesia Type:General  Level of Consciousness: awake, alert , oriented and patient cooperative  Airway & Oxygen Therapy: Patient Spontanous Breathing and Patient connected to face mask oxygen  Post-op Assessment: Report given to RN and Post -op Vital signs reviewed and stable  Post vital signs: Reviewed and stable  Last Vitals:  Vitals:   02/02/16 0957 02/02/16 1226  BP: 134/77   Pulse: 64 95  Resp: 18 (!) 21  Temp: 36.4 C     Last Pain:  Vitals:   02/02/16 0957  TempSrc: Oral      Patients Stated Pain Goal: 0 ([phone] [id number])  Complications: No apparent anesthesia complications

## 2016-02-03 ENCOUNTER — Encounter (HOSPITAL_BASED_OUTPATIENT_CLINIC_OR_DEPARTMENT_OTHER): Payer: Self-pay | Admitting: Orthopedic Surgery

## 2016-02-03 NOTE — Op Note (Signed)
NAME:  Kathryn Powell, Kathryn Powell NO.:  [id number]  MEDICAL RECORD NO.:  [id number]  LOCATION:                                 FACILITY:  PHYSICIAN:  Wylene Simmer, MD        DATE OF BIRTH:  January 26, 1941  DATE OF PROCEDURE:  02/02/2016 DATE OF DISCHARGE:                              OPERATIVE REPORT   PREOPERATIVE DIAGNOSES: 1. Left hallux rigidus. 2. Retained hardware, left hallux. 3. Left second hammertoe. 4. Left second and third metatarsal metatarsalgia.  POSTOPERATIVE DIAGNOSES: 1. Left hallux rigidus. 2. Retained hardware, left hallux. 3. Left second hammertoe. 4. Left second and third metatarsal metatarsalgia.  PROCEDURES: 1. Left hallux metatarsophalangeal joint arthrodesis. 2. Left second metatarsal Weil osteotomy through a separate incision. 3. Left third metatarsal Weil osteotomy through a separate incision. 4. Left second hammertoe correction through a separate incision. 5. Percutaneous flexor digitorum longus tenotomy at the left second     toe through a separate incision. 6. Left foot AP and lateral radiographs.  SURGEON:  Wylene Simmer, MD.  ASSISTANT:  Mechele Claude, PA-C.  ANESTHESIA:  General.  ESTIMATED BLOOD LOSS:  Minimal.  TOURNIQUET TIME:  59 minutes at 250 mmHg.  COMPLICATIONS:  None apparent.  DISPOSITION:  Extubated, awake, and stable to Recovery.  INDICATIONS FOR PROCEDURE:  The patient is a 75 year old woman, who has a history of left forefoot reconstructive surgery in the remote past. 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  She also has recurrence of her second hammertoe deformity with symptoms of painful metatarsalgia at the second and third metatarsal heads.  She presents today for revision forefoot reconstruction.  She understands the risks and benefits, the alternative treatment options, and elects surgical treatment.  She specifically understands risks of bleeding,  infection, nerve damage, blood clots, need for additional surgery, continued pain, nonunion, amputation, and death.  PROCEDURE IN DETAIL:  After preoperative consent was obtained and the correct operative site was identified, the patient was brought to the operating room and placed supine on the operating table.  General anesthesia was induced.  Preoperative antibiotics were administered. Surgical time-out was taken.  Left lower extremity was prepped and draped in standard sterile fashion with tourniquet around the thigh. The extremity was exsanguinated and the tourniquet was inflated to 250 mmHg.  The previous dorsal incision was identified, it was opened again sharply and extended distally and laterally.  Dissection was carried down through the subcutaneous tissues and the dorsal joint capsule was incised and elevated exposing the metatarsal head.  A 20 mm convex reamer was used to remove the remaining subchondral bone and articular cartilage.  The base of the proximal phalanx was then exposed, and a 20 mm convex reamer was used to remove the remaining articular cartilage and subchondral bone.  The joint was then reduced and provisionally pinned.  AP and lateral radiographs confirmed appropriate alignment of the joint.  Joint was then fixed with a 4 mm Biomet cannulated screw.  A 4-hole, 1/4-tubular plate from the Biomet mini-frag set was then contoured to fit the dorsum of the joint.  It was secured proximally and distally  with bicortical and unicortical screws.  Wound was irrigated copiously.  Subcutaneous tissues were approximated with inverted simple sutures of 3-0 Monocryl.  Horizontal mattress sutures of 3-0 nylon were used to close the skin incision.  Attention was then turned to the second MTP joint, where a longitudinal incision was made.  Dissection was carried down through the subcutaneous tissues and the dorsal joint capsule was incised.  The extensor tendons were  protected, and the metatarsal head was exposed.  A Weil osteotomy was then made with an oscillating saw removing a small wedge of bone distally.  The head was allowed to retract proximally, and the osteotomy site was fixed with a 2 mm Biomet FRS screw.  The same procedure was then performed through a separate incision for the third MTP joint shortening the third metatarsal and securing the osteotomy with a 2 mm screw.  Attention was then turned to the second toe.  The flexor digitorum longus was noted to be tight, so it was released in a percutaneous fashion from the distal phalanx.  A transverse incision was then made over the IP joint.  Dissection was carried down through the skin and subcutaneous tissues and extensor mechanism.  The joint was then prepared by removing the articular cartilage and subchondral bone from both sides.  Joint was reduced and fixed with a 2.5 mm Biomet VPC screw. AP and lateral radiograph showed appropriate position, length of all hardware, and appropriate correction of the forefoot deformities. Wounds were irrigated and closed with Monocryl and nylon.  Sterile dressings were applied followed by a well-padded short-leg splint. Tourniquet was released after application of dressings at 59 minutes. The patient was awakened from anesthesia and transported to the recovery room in stable condition.  FOLLOWUP PLAN:  The patient will be nonweightbearing on the left lower extremity.  She will follow up with me in the office in 2 weeks for suture removal and conversion to a CAM walker boot.  She will take aspirin 81 mg p.o. b.i.d. for DVT prophylaxis.  Mechele Claude, PA-C, was present and scrubbed for the duration of the case.  His assistance was essential in positioning the patient, prepping and draping, gaining and maintaining exposure, performing the operation, and closing and dressing the wounds.  RADIOGRAPHS:  AP and lateral radiographs of the left foot were  obtained intraoperatively.  These show interval correction of hallux rigidus and second and third hammertoe deformities.  Hardware is appropriately positioned and of the appropriate length.  No other acute injuries are noted.     Wylene Simmer, MD     JH/MEDQ  D:  02/02/2016  T:  02/03/2016  Job:  [id number]

## 2016-10-09 ENCOUNTER — Other Ambulatory Visit (HOSPITAL_COMMUNITY): Payer: Self-pay | Admitting: Family Medicine

## 2016-10-09 DIAGNOSIS — Z1231 Encounter for screening mammogram for malignant neoplasm of breast: Secondary | ICD-10-CM

## 2016-10-24 ENCOUNTER — Ambulatory Visit (HOSPITAL_COMMUNITY)
Admission: RE | Admit: 2016-10-24 | Discharge: 2016-10-24 | Disposition: A | Discharge status: Home / Self Care | Payer: Medicare HMO | Source: Ambulatory Visit | Attending: Family Medicine | Admitting: Family Medicine

## 2016-10-24 DIAGNOSIS — Z1231 Encounter for screening mammogram for malignant neoplasm of breast: Secondary | ICD-10-CM | POA: Diagnosis present

## 2017-09-16 ENCOUNTER — Other Ambulatory Visit (HOSPITAL_COMMUNITY): Payer: Self-pay | Admitting: Family Medicine

## 2017-09-16 DIAGNOSIS — Z1231 Encounter for screening mammogram for malignant neoplasm of breast: Secondary | ICD-10-CM

## 2017-10-28 ENCOUNTER — Ambulatory Visit (HOSPITAL_COMMUNITY)
Admission: RE | Admit: 2017-10-28 | Discharge: 2017-10-28 | Disposition: A | Discharge status: Home / Self Care | Payer: Medicare HMO | Source: Ambulatory Visit | Attending: Family Medicine | Admitting: Family Medicine

## 2017-10-28 ENCOUNTER — Encounter (HOSPITAL_COMMUNITY): Payer: Self-pay

## 2017-10-28 DIAGNOSIS — Z1231 Encounter for screening mammogram for malignant neoplasm of breast: Secondary | ICD-10-CM | POA: Insufficient documentation

## 2017-11-06 ENCOUNTER — Encounter: Payer: Self-pay | Admitting: Cardiology

## 2017-11-06 NOTE — Progress Notes (Signed)
Cardiology Office Note  Date: 11/07/2017   ID: Clayton, Jarmon 02/18/1941, MRN [id number]  PCP: Deloria Lair., MD  Evaluating Cardiologist: Rozann Lesches, MD   Chief Complaint  Patient presents with  . Coronary Artery Disease  . Valvular heart disease    History of Present Illness: Kathryn Powell is a 77 y.o. female presenting as a new patient to establish cardiology follow-up.  She has been following with the [facility] practice, last saw Dr. Prince Rome in November 2018.  She describes herself as being fairly active.  She exercises on an elliptical machine a few days a week, usually for an hour at a time.  Also works outdoors and enjoys walking, sometimes up to 4 miles at a time.  She reports no breathlessness over NYHA class II.  No palpitations or exertional chest pain.  She has had no syncope.  Echocardiogram done in November of last year with [facility] reportedly indicated moderate mitral and tricuspid regurgitation, I am not able to pull the report.  She is aware of having some degree of "valve leakage."  We went over her medications.  Current cardiac regimen includes aspirin, Lipitor, Lopressor, and Dyazide.  She is establishing with new PCP in Dr. Rayna Sexton office later this summer.  I personally reviewed her ECG today which shows sinus rhythm with PVC and nonspecific anteroseptal T wave changes.  Past Medical History:  Diagnosis Date  . Colon cancer [facility])    Status post surgical resection  . Coronary artery disease    DES to LAD 04/2014 ([facility])  . Hypertension   . Hypothyroidism   . Mitral regurgitation     Past Surgical History:  Procedure Laterality Date  . ARTHRODESIS METATARSALPHALANGEAL JOINT (MTPJ) Left 02/02/2016   Procedure: ARTHRODESIS LEFT METATARSALPHALANGEAL JOINT (MTPJ);  Surgeon: Wylene Simmer, MD;  Location: [place];  Service: Orthopedics;  Laterality: Left;  . BREAST LUMPECTOMY     left  . BUNIONECTOMY     Bilateral  . CARDIAC  VALVE SURGERY  Dec. 11,2015   heart stent placed per pt. Rosine Door  . COLON SURGERY  over 10 years ago  . FOOT SURGERY     with pins- right  . HAMMER TOE SURGERY Left 02/02/2016   Procedure: LEFT SECOND MALLET TOE CORRECTION;  Surgeon: Wylene Simmer, MD;  Location: [place];  Service: Orthopedics;  Laterality: Left;  . METATARSAL OSTEOTOMY Left 02/02/2016   Procedure: 2 - 3 WEIL METATARSAL OSTEOTOMY;  Surgeon: Wylene Simmer, MD;  Location: [place];  Service: Orthopedics;  Laterality: Left;  Marland Kitchen VAGINAL HYSTERECTOMY      Current Outpatient Medications  Medication Sig Dispense Refill  . aspirin (ASPIRIN EC) 81 MG EC tablet Take 1 tablet (81 mg total) by mouth 2 (two) times daily. 30 tablet 12  . atorvastatin (LIPITOR) 40 MG tablet Take 1 tablet by mouth daily.    . calcium carbonate (OS-CAL - DOSED IN MG OF ELEMENTAL CALCIUM) 1250 MG tablet Take 1 tablet by mouth daily. 1000 mg    . cholecalciferol (VITAMIN D) 1000 UNITS tablet Take 1,000 Units by mouth daily.      . fish oil-omega-3 fatty acids 1000 MG capsule Take 1 g by mouth daily. Take 1200mg   2 tabs daily    . levothyroxine (SYNTHROID, LEVOTHROID) 100 MCG tablet Take 100 mcg by mouth daily.      . magnesium 30 MG tablet Take 30 mg by mouth daily.     Marland Kitchen  metoprolol tartrate (LOPRESSOR) 25 MG tablet Take 25 mg by mouth 2 (two) times daily.      . Multiple Vitamins-Minerals (MULTIVITAMIN,TX-MINERALS) tablet Take 1 tablet by mouth daily.      Marland Kitchen triamterene-hydrochlorothiazide (DYAZIDE) 37.5-25 MG per capsule Take 1 capsule by mouth every morning.       No current facility-administered medications for this visit.    Allergies:  Lisinopril and Sulfa antibiotics   Social History: The patient  reports that she has never smoked. She has never used smokeless tobacco. She reports that she drinks alcohol. She reports that she does not use drugs.   Family History: The patient's family history includes Hypertension in her  brother and mother.   ROS:  Please see the history of present illness. Otherwise, complete review of systems is positive for none.  All other systems are reviewed and negative.   Physical Exam: VS:  BP 118/80   Pulse 72   Ht 5\' 6"  (1.676 m)   Wt 174 lb (78.9 kg)   SpO2 98%   BMI 28.08 kg/m , BMI Body mass index is 28.08 kg/m.  Wt Readings from Last 3 Encounters:  11/07/17 174 lb (78.9 kg)  02/02/16 184 lb (83.5 kg)  07/27/15 177 lb (80.3 kg)    General: Patient appears comfortable at rest. HEENT: Conjunctiva and lids normal, oropharynx clear. Neck: Supple, no elevated JVP or carotid bruits, no thyromegaly. Lungs: Clear to auscultation, nonlabored breathing at rest. Cardiac: Regular rate and rhythm, no S3, soft apical systolic murmur, no pericardial rub. Abdomen: Soft, nontender, bowel sounds present. Extremities: No pitting edema, distal pulses 2+. Skin: Warm and dry. Musculoskeletal: No kyphosis. Neuropsychiatric: Alert and oriented x3, affect grossly appropriate.  ECG: I personally reviewed the tracing from 02/01/2016 which showed sinus rhythm.  Recent Labwork:  September 2017: Potassium 3.9, BUN 17, creatinine 0.73  Other Studies Reviewed Today:  Cardiac catheterization and PCI 05/07/2014 ([facility]): Native Coronary Anatomy 1. Left Main: Medium caliber vessel without significant disease. 2. LAD: Medium caliber vessel. Proximally free disease. Midportion had 60% followed by a 90% lesion noted. Remainder was free disease. One small to medium-size diagonal noted. 3. Circumflex: Medium-size nondominant. 2 major obtuse marginals without significant disease. 4. Right Coronary Artery: Medium-size and dominant. Midportion had some mild 20% disease. Right PDA and PLV branches free disease. 5. Hemodynamics: Aortic pressure 154/70 with a mean of 100  Equipment Used: 1. 6 French XB 3.5 guide without sideholes 2. Short BMW wire 3. 2.5 x 15 mm trek balloon 4. 3.0 x 23 mm Alpine  drug-eluting stent 5. 3.0 x 15 mm Graham trek balloon  Medications Used: 1. Unfractionated heparin IV 2. Effient 60 mg orally. 3. Intracoronary nitroglycerin  Description of Coronary Intervention: After diagnostic angiograms were performed a 6 [place] guide was brought up into the ostium of the left main and provide adequate backup support. The LAD was wired with a short BMW wire. Initially the mid LAD lesion was predilated with a 2.5 x 15 mm trek  balloon.  Next a 3.0 x 23 mm Alpine drug-eluting stent was delivered and deployed in the mid LAD. The stent was then postdilated in its entirety at high pressure with a 3.0 x 15 mm [place] trek balloon.  Final angiograms show no evidence of stent malposition, vessel perforation, or dissection. There was apparent jailing of the diagonal side branch with slow TIMI 2 flow down this branch but this appear to be coming back.  With the flow being restored the  decision was made not to do any further intervention to this vessel. The patient was chest pain-free.  A TR band was placed in the right wrist for hemostasis once all equipment was removed from the body.  Conclusions: 1. Single vessel obstructive coronary artery disease 2. PCI to the mid LAD with a 3.0 x 23 mm Alpine drug-eluting stent. Lesion length was 18 mm mild calcium no thrombus. Lesion went from 90% pre-to 0% post residual. Pre-TIMI flow is 3, post TIMI flow is 3 3. Right radial access.  Assessment and Plan:  1.  CAD with history of DES to the mid LAD in December 2015 at [facility].  She has been followed by the [facility] cardiology practice since that time and is now transitioning to this group.  I reviewed her records in detail.  She does not report any angina symptoms and has good functional capacity.  Follow-up ECG reviewed.  No clear indication for stress testing at this time other than duration since intervention.  We will continue with medical therapy and observation at this point.  We did  discuss warning signs and symptoms.  2.  Valvular heart disease, reportedly moderate mitral and tricuspid regurgitation by echocardiogram last year.  I am not able to pull the report.  We will obtain an echocardiogram to establish baseline and follow-up.  3.  Essential hypertension by history, blood pressure is normal today.  No changes in current regimen.  Keep follow-up with PCP.  Current medicines were reviewed with the patient today.   Orders Placed This Encounter  Procedures  . EKG 12-Lead  . ECHOCARDIOGRAM COMPLETE    Disposition: Follow-up in 6 months.  Signed, Satira Sark, MD, [facility] 11/07/2017 10:56 AM    [facility] [place], [place], [place] [address] Phone: [phone]; Fax: [phone]

## 2017-11-07 ENCOUNTER — Telehealth: Payer: Self-pay | Admitting: Cardiology

## 2017-11-07 ENCOUNTER — Encounter: Payer: Self-pay | Admitting: Cardiology

## 2017-11-07 ENCOUNTER — Ambulatory Visit: Payer: Medicare HMO | Admitting: Cardiology

## 2017-11-07 VITALS — BP 118/80 | HR 72 | Ht 66.0 in | Wt 174.0 lb

## 2017-11-07 DIAGNOSIS — I25119 Atherosclerotic heart disease of native coronary artery with unspecified angina pectoris: Secondary | ICD-10-CM | POA: Diagnosis not present

## 2017-11-07 DIAGNOSIS — I340 Nonrheumatic mitral (valve) insufficiency: Secondary | ICD-10-CM

## 2017-11-07 DIAGNOSIS — I10 Essential (primary) hypertension: Secondary | ICD-10-CM

## 2017-11-07 NOTE — Telephone Encounter (Signed)
Echo- Mitral regurgitation  Scheduled at [facility]  July 18,2019

## 2017-11-07 NOTE — Patient Instructions (Signed)

## 2017-11-25 ENCOUNTER — Other Ambulatory Visit: Payer: Self-pay | Admitting: Cardiology

## 2017-11-25 MED ORDER — ATORVASTATIN CALCIUM 40 MG PO TABS: 40.0000 mg | ORAL_TABLET | Freq: Every day | ORAL | 3 refills | Status: DC

## 2017-11-25 NOTE — Telephone Encounter (Signed)
Done

## 2017-11-25 NOTE — Telephone Encounter (Signed)
° °  1. Which medications need to be refilled? (please list name of each medication and dose if known) atorvastatin (LIPITOR) 40 MG tablet    2. Which pharmacy/location (including street and city if local pharmacy) is medication to be sent to?  3. Do they need a 30 day or 90 day supply? Kathryn Powell

## 2017-12-12 ENCOUNTER — Other Ambulatory Visit: Payer: Self-pay

## 2017-12-12 ENCOUNTER — Ambulatory Visit (INDEPENDENT_AMBULATORY_CARE_PROVIDER_SITE_OTHER): Payer: Medicare HMO

## 2017-12-12 DIAGNOSIS — I340 Nonrheumatic mitral (valve) insufficiency: Secondary | ICD-10-CM

## 2017-12-13 ENCOUNTER — Telehealth: Payer: Self-pay | Admitting: *Deleted

## 2017-12-13 NOTE — Telephone Encounter (Signed)
-----   Message from Satira Sark, MD sent at 12/13/2017  4:42 PM EDT ----- Results reviewed. LVEF is normal range at 60-65%. There is moderate mitral and mild to moderate tricuspid regurgitation which is stable compared to description of her last outside study. Keep planned follow-up in months.  A copy of this test should be forwarded to Deloria Lair., MD.

## 2017-12-13 NOTE — Telephone Encounter (Signed)
Patient informed and copy sent to PCP. 

## 2017-12-18 ENCOUNTER — Telehealth: Payer: Self-pay | Admitting: Cardiology

## 2017-12-18 NOTE — Telephone Encounter (Signed)
Pt says a few times over the past few weeks has had a sharp pain on the left side of her chest lasting at the most 5 seconds at a time. Denies any weakness/SOB/dizziness - says BP/HR has been WNL - can exercise for over an hour without any symptoms (usues elliptical)  was concerned about her stents possible needing to be replaced or another blockage - pt aware that may be just something to monitor since not happening often and would forward to provider for any further recs

## 2017-12-18 NOTE — Telephone Encounter (Signed)
Patient just recently established in the office in June.  She has a history of DES to the LAD in 2015 at [facility].  The symptoms reported (sharp, left-sided chest pain lasting generally less than 5 seconds) are atypical for an ischemic etiology.  It is more reassuring to note that she has been able to exercise on an elliptical machine for over an hour without symptoms.  We held off on follow-up stress testing when I met her recently.  If she has further concerns, we can always arrange a follow-up exercise Myoview (she would need to hold Lopressor for this test).

## 2017-12-18 NOTE — Telephone Encounter (Signed)
Pt voiced understanding - says she would monitor symptoms and call us back if she wanted to schedule stress test - has f/u with pcp for physical coming up

## 2017-12-18 NOTE — Telephone Encounter (Signed)
Kathryn Powell called stating that the last time she saw Dr. Domenic Polite he mentioned that she might need a stent. Kathryn Powell is wanting to know if the echo would should if she would need to have a stent.  States that she has had a couple of sharpe pains go thru her chest.

## 2018-05-08 NOTE — Progress Notes (Signed)
Cardiology Office Note  Date: 05/09/2018   ID: Kathryn, Powell [birth year], MRN [id number]  PCP: Sandi Mealy, MD  Primary Cardiologist: Rozann Lesches, MD   Chief Complaint  Patient presents with  . Coronary Artery Disease    History of Present Illness: Kathryn Powell is a 77 y.o. female last seen in June at which point she established with our practice.  She is here for a routine follow-up visit.  She does not report any angina symptoms or worsening shortness of breath.  She still exercises on elliptical machine up to an hour at a time, 3 days a week.   Follow-up echocardiogram from July showed LVEF 60 to 65% with grade 2 diastolic dysfunction, moderate mitral regurgitation, and mild to moderate tricuspid regurgitation.  These findings were stable compared to previous report.  We discussed this today.  I reviewed her medications which are outlined below and stable from a cardiac perspective.  Past Medical History:  Diagnosis Date  . Colon cancer [facility])    Status post surgical resection  . Coronary artery disease    DES to LAD 04/2014 ([facility])  . Hypertension   . Hypothyroidism   . Mitral regurgitation     Past Surgical History:  Procedure Laterality Date  . ARTHRODESIS METATARSALPHALANGEAL JOINT (MTPJ) Left 02/02/2016   Procedure: ARTHRODESIS LEFT METATARSALPHALANGEAL JOINT (MTPJ);  Surgeon: Wylene Simmer, MD;  Location: [place];  Service: Orthopedics;  Laterality: Left;  . BREAST LUMPECTOMY     left  . BUNIONECTOMY     Bilateral  . CARDIAC VALVE SURGERY  Dec. 11,2015   heart stent placed per pt. Rosine Door  . COLON SURGERY  over 10 years ago  . FOOT SURGERY     with pins- right  . HAMMER TOE SURGERY Left 02/02/2016   Procedure: LEFT SECOND MALLET TOE CORRECTION;  Surgeon: Wylene Simmer, MD;  Location: [place];  Service: Orthopedics;  Laterality: Left;  . METATARSAL OSTEOTOMY Left 02/02/2016   Procedure: 2 - 3 WEIL METATARSAL  OSTEOTOMY;  Surgeon: Wylene Simmer, MD;  Location: [place];  Service: Orthopedics;  Laterality: Left;  Marland Kitchen VAGINAL HYSTERECTOMY      Current Outpatient Medications  Medication Sig Dispense Refill  . aspirin (ASPIRIN EC) 81 MG EC tablet Take 1 tablet (81 mg total) by mouth 2 (two) times daily. 30 tablet 12  . atorvastatin (LIPITOR) 40 MG tablet Take 1 tablet (40 mg total) by mouth daily. 90 tablet 3  . calcium carbonate (OS-CAL - DOSED IN MG OF ELEMENTAL CALCIUM) 1250 MG tablet Take 1 tablet by mouth daily. 1000 mg    . cholecalciferol (VITAMIN D) 1000 UNITS tablet Take 1,000 Units by mouth daily.      . fish oil-omega-3 fatty acids 1000 MG capsule Take 1 g by mouth daily. Take 1200mg   2 tabs daily    . levothyroxine (SYNTHROID, LEVOTHROID) 100 MCG tablet Take 100 mcg by mouth daily.      . magnesium 30 MG tablet Take 30 mg by mouth daily.     . metoprolol tartrate (LOPRESSOR) 25 MG tablet Take 25 mg by mouth 2 (two) times daily.      . Multiple Vitamins-Minerals (MULTIVITAMIN,TX-MINERALS) tablet Take 1 tablet by mouth daily.      Marland Kitchen triamterene-hydrochlorothiazide (DYAZIDE) 37.5-25 MG per capsule Take 1 capsule by mouth every morning.       No current facility-administered medications for this visit.    Allergies:  Lisinopril  and Sulfa antibiotics   Social History: The patient  reports that she has never smoked. She has never used smokeless tobacco. She reports current alcohol use. She reports that she does not use drugs.   ROS:  Please see the history of present illness. Otherwise, complete review of systems is positive for arthritic knee pain.  All other systems are reviewed and negative.   Physical Exam: VS:  BP 138/78   Pulse 65   Ht 5' 6.5" (1.689 m)   Wt 184 lb (83.5 kg)   SpO2 95%   BMI 29.25 kg/m , BMI Body mass index is 29.25 kg/m.  Wt Readings from Last 3 Encounters:  05/09/18 184 lb (83.5 kg)  11/07/17 174 lb (78.9 kg)  02/02/16 184 lb (83.5 kg)      General: Patient appears comfortable at rest. HEENT: Conjunctiva and lids normal, oropharynx clear. Neck: Supple, no elevated JVP or carotid bruits, no thyromegaly. Lungs: Clear to auscultation, nonlabored breathing at rest. Cardiac: Regular rate and rhythm, no S3, soft systolic murmur. Abdomen: Soft, nontender, bowel sounds present. Extremities: No pitting edema, distal pulses 2+. Skin: Warm and dry. Musculoskeletal: No kyphosis. Neuropsychiatric: Alert and oriented x3, affect grossly appropriate.  ECG: I personally reviewed the tracing from 11/07/2017 which showed sinus rhythm with PVC and nonspecific anteroseptal T wave changes.  Recent Labwork:  September 2017: Potassium 3.9, BUN 17, creatinine 0.73  Other Studies Reviewed Today:  Cardiac catheterization and PCI 05/07/2014 ([facility]): Native Coronary Anatomy 1. Left Main: Medium caliber vessel without significant disease. 2. LAD: Medium caliber vessel. Proximally free disease. Midportion had 60% followed by a 90% lesion noted. Remainder was free disease. One small to medium-size diagonal noted. 3. Circumflex: Medium-size nondominant. 2 major obtuse marginals without significant disease. 4. Right Coronary Artery: Medium-size and dominant. Midportion had some mild 20% disease. Right PDA and PLV branches free disease. 5. Hemodynamics: Aortic pressure 154/70 with a mean of 100  Equipment Used: 1. 6 French XB 3.5 guide without sideholes 2. Short BMW wire 3. 2.5 x 15 mm trek balloon 4. 3.0 x 23 mm Alpine drug-eluting stent 5. 3.0 x 15 mm [place] trek balloon  Medications Used: 1. Unfractionated heparin IV 2. Effient 60 mg orally. 3. Intracoronary nitroglycerin  Description of Coronary Intervention: After diagnostic angiograms were performed a 6 [place] guide was brought up into the ostium of the left main and provide adequate backup support. The LAD was wired with a short BMW wire. Initially the mid LAD lesion was predilated with a 2.5  x 15 mm trek  balloon.  Next a 3.0 x 23 mm Alpine drug-eluting stent was delivered and deployed in the mid LAD. The stent was then postdilated in its entirety at high pressure with a 3.0 x 15 mm [place] trek balloon.  Final angiograms show no evidence of stent malposition, vessel perforation, or dissection. There was apparent jailing of the diagonal side branch with slow TIMI 2 flow down this branch but this appear to be coming back.  With the flow being restored the decision was made not to do any further intervention to this vessel. The patient was chest pain-free.  A TR band was placed in the right wrist for hemostasis once all equipment was removed from the body.  Conclusions: 1. Single vessel obstructive coronary artery disease 2. PCI to the mid LAD with a 3.0 x 23 mm Alpine drug-eluting stent. Lesion length was 18 mm mild calcium no thrombus. Lesion went from 90% pre-to 0% post residual.  Pre-TIMI flow is 3, post TIMI flow is 3 3. Right radial access.  Echocardiogram 12/12/2017: Study Conclusions  - Left ventricle: The cavity size was normal. There was mild   concentric hypertrophy. Systolic function was normal. The   estimated ejection fraction was in the range of 60% to 65%. Wall   motion was normal; there were no regional wall motion   abnormalities. Features are consistent with a pseudonormal left   ventricular filling pattern, with concomitant abnormal relaxation   and increased filling pressure (grade 2 diastolic dysfunction).   Doppler parameters are consistent with high ventricular filling   pressure. - Aortic valve: Trileaflet; mildly thickened, mildly calcified   leaflets. There was mild regurgitation. - Mitral valve: Mildly calcified annulus. Normal thickness leaflets. There was moderate    regurgitation. - Tricuspid valve: There was mild-moderate regurgitation. - Pulmonic valve: There was mild regurgitation. - Pulmonary arteries: PA peak pressure: 37 mm Hg  (S).  Assessment and Plan:  1.  CAD status post DES to the mid LAD in December 2015.  She reports no angina symptoms with good exercise tolerance and we will plan to continue with observation at this time on medical therapy.  No changes were made today.  2.  Mitral and tricuspid regurgitation, last assessed in July of this year.  She is asymptomatic at this point.  We will follow-up with an echocardiogram prior to her next visit in 6 months.  3.  Mixed hyperlipidemia, on Lipitor.  She follows with Dr. Lorra Hals.  4.  Essential hypertension, on Lopressor and Dyazide.  No changes to current regimen.  Current medicines were reviewed with the patient today.   Orders Placed This Encounter  Procedures  . ECHOCARDIOGRAM COMPLETE    Disposition: Follow-up in 6 months.  Signed, Satira Sark, MD, [facility] 05/09/2018 11:11 AM    [facility] [place], [place], [place] [address] Phone: [phone]; Fax: [phone]

## 2018-05-09 ENCOUNTER — Ambulatory Visit: Payer: Medicare HMO | Admitting: Cardiology

## 2018-05-09 ENCOUNTER — Telehealth: Payer: Self-pay | Admitting: Cardiology

## 2018-05-09 ENCOUNTER — Encounter: Payer: Self-pay | Admitting: Cardiology

## 2018-05-09 VITALS — BP 138/78 | HR 65 | Ht 66.5 in | Wt 184.0 lb

## 2018-05-09 DIAGNOSIS — I340 Nonrheumatic mitral (valve) insufficiency: Secondary | ICD-10-CM | POA: Diagnosis not present

## 2018-05-09 DIAGNOSIS — I25119 Atherosclerotic heart disease of native coronary artery with unspecified angina pectoris: Secondary | ICD-10-CM

## 2018-05-09 DIAGNOSIS — I10 Essential (primary) hypertension: Secondary | ICD-10-CM

## 2018-05-09 DIAGNOSIS — E782 Mixed hyperlipidemia: Secondary | ICD-10-CM

## 2018-05-09 NOTE — Patient Instructions (Addendum)
Medication Instructions:   Your physician recommends that you continue on your current medications as directed. Please refer to the Current Medication list given to you today.  Labwork:  NONE  Testing/Procedures: Your physician has requested that you have an echocardiogram in 6 months just before your next visit. Echocardiography is a painless test that uses sound waves to create images of your heart. It provides your doctor with information about the size and shape of your heart and how well your heart's chambers and valves are working. This procedure takes approximately one hour. There are no restrictions for this procedure.  Follow-Up:  Your physician recommends that you schedule a follow-up appointment in: 6 months. You will receive a reminder letter in the mail in about 4 months reminding you to call and schedule your appointment. If you don't receive this letter, please contact our office.  Any Other Special Instructions Will Be Listed Below (If Applicable).  If you need a refill on your cardiac medications before your next appointment, please call your pharmacy. 

## 2018-05-09 NOTE — Telephone Encounter (Signed)
°  Precert needed for: 2 D Echo dx: mitral regurgitation   Location: [facility]    Date:  November 11, 2017

## 2018-07-03 ENCOUNTER — Other Ambulatory Visit: Payer: Self-pay | Admitting: Cardiology

## 2018-07-03 DIAGNOSIS — I059 Rheumatic mitral valve disease, unspecified: Secondary | ICD-10-CM

## 2018-07-03 DIAGNOSIS — I340 Nonrheumatic mitral (valve) insufficiency: Secondary | ICD-10-CM

## 2018-10-13 ENCOUNTER — Other Ambulatory Visit (HOSPITAL_COMMUNITY): Payer: Self-pay | Admitting: Family Medicine

## 2018-10-13 DIAGNOSIS — Z1231 Encounter for screening mammogram for malignant neoplasm of breast: Secondary | ICD-10-CM

## 2018-11-03 ENCOUNTER — Ambulatory Visit (HOSPITAL_COMMUNITY)
Admission: RE | Admit: 2018-11-03 | Discharge: 2018-11-03 | Disposition: A | Discharge status: Home / Self Care | Payer: Medicare HMO | Source: Ambulatory Visit | Attending: Family Medicine | Admitting: Family Medicine

## 2018-11-03 ENCOUNTER — Other Ambulatory Visit: Payer: Self-pay

## 2018-11-03 DIAGNOSIS — Z1231 Encounter for screening mammogram for malignant neoplasm of breast: Secondary | ICD-10-CM | POA: Insufficient documentation

## 2018-11-11 ENCOUNTER — Telehealth: Payer: Self-pay | Admitting: Cardiology

## 2018-11-11 NOTE — Telephone Encounter (Signed)
Virtual Visit Pre-Appointment Phone Call  "(Name), I am calling you today to discuss your upcoming appointment. We are currently trying to limit exposure to the virus that causes COVID-19 by seeing patients at home rather than in the office."  1. "What is the BEST phone number to call the day of the visit?" - include this in appointment notes  2. Do you have or have access to (through a family member/friend) a smartphone with video capability that we can use for your visit?" a. If yes - list this number in appt notes as cell (if different from BEST phone #) and list the appointment type as a VIDEO visit in appointment notes b. If no - list the appointment type as a PHONE visit in appointment notes  3. Confirm consent - "In the setting of the current Covid19 crisis, you are scheduled for a (phone or video) visit with your provider on (date) at (time).  Just as we do with many in-office visits, in order for you to participate in this visit, we must obtain consent.  If you'd like, I can send this to your mychart (if signed up) or email for you to review.  Otherwise, I can obtain your verbal consent now.  All virtual visits are billed to your insurance company just like a normal visit would be.  By agreeing to a virtual visit, we'd like you to understand that the technology does not allow for your provider to perform an examination, and thus may limit your provider's ability to fully assess your condition. If your provider identifies any concerns that need to be evaluated in person, we will make arrangements to do so.  Finally, though the technology is pretty good, we cannot assure that it will always work on either your or our end, and in the setting of a video visit, we may have to convert it to a phone-only visit.  In either situation, we cannot ensure that we have a secure connection.  Are you willing to proceed?" STAFF: Did the patient verbally acknowledge consent to telehealth visit? Document  YES/NO here: yes  4. Advise patient to be prepared - "Two hours prior to your appointment, go ahead and check your blood pressure, pulse, oxygen saturation, and your weight (if you have the equipment to check those) and write them all down. When your visit starts, your provider will ask you for this information. If you have an Apple Watch or Kardia device, please plan to have heart rate information ready on the day of your appointment. Please have a pen and paper handy nearby the day of the visit as well."  5. Give patient instructions for MyChart download to smartphone OR [email or web address] as below if video visit (depending on what platform provider is using)  6. Inform patient they will receive a phone call 15 minutes prior to their appointment time (may be from unknown caller ID) so they should be prepared to answer    TELEPHONE CALL NOTE  Kathryn Powell has been deemed a candidate for a follow-up tele-health visit to limit community exposure during the Covid-19 pandemic. I spoke with the patient via phone to ensure availability of phone/video source, confirm preferred email & phone number, and discuss instructions and expectations.  I reminded Kathryn Powell to be prepared with any vital sign and/or heart rhythm information that could potentially be obtained via home monitoring, at the time of her visit. I reminded Kathryn Powell to expect a phone call prior to  her visit.  [name] 11/11/2018 12:15 PM   INSTRUCTIONS FOR DOWNLOADING THE MYCHART APP TO SMARTPHONE  - The patient must first make sure to have activated MyChart and know their login information - If Apple, go to [employer] and type in MyChart in the search bar and download the app. If Android, ask patient to go to [employer] and type in [place] in the search bar and download the app. The app is free but as with any other app downloads, their phone may require them to verify saved payment information or  Apple/Android password.  - The patient will need to then log into the app with their MyChart username and password, and select [facility] as their healthcare provider to link the account. When it is time for your visit, go to the MyChart app, find appointments, and click Begin Video Visit. Be sure to Select Allow for your device to access the Microphone and Camera for your visit. You will then be connected, and your provider will be with you shortly.  **If they have any issues connecting, or need assistance please contact MyChart service desk [phone])**  **If using a computer, in order to ensure the best quality for their visit they will need to use either of the following Internet Browsers: [employer], or Google Chrome**  IF USING DOXIMITY or DOXY.ME - The patient will receive a link just prior to their visit by text.     FULL LENGTH CONSENT FOR TELE-HEALTH VISIT   I hereby voluntarily request, consent and authorize [facility] and its employed or contracted physicians, physician assistants, nurse practitioners or other licensed health care professionals (the Practitioner), to provide me with telemedicine health care services (the Services") as deemed necessary by the treating Practitioner. I acknowledge and consent to receive the Services by the Practitioner via telemedicine. I understand that the telemedicine visit will involve communicating with the Practitioner through live audiovisual communication technology and the disclosure of certain medical information by electronic transmission. I acknowledge that I have been given the opportunity to request an in-person assessment or other available alternative prior to the telemedicine visit and am voluntarily participating in the telemedicine visit.  I understand that I have the right to withhold or withdraw my consent to the use of telemedicine in the course of my care at any time, without affecting my right to future care  or treatment, and that the Practitioner or I may terminate the telemedicine visit at any time. I understand that I have the right to inspect all information obtained and/or recorded in the course of the telemedicine visit and may receive copies of available information for a reasonable fee.  I understand that some of the potential risks of receiving the Services via telemedicine include:   Delay or interruption in medical evaluation due to technological equipment failure or disruption;  Information transmitted may not be sufficient (e.g. poor resolution of images) to allow for appropriate medical decision making by the Practitioner; and/or   In rare instances, security protocols could fail, causing a breach of personal health information.  Furthermore, I acknowledge that it is my responsibility to provide information about my medical history, conditions and care that is complete and accurate to the best of my ability. I acknowledge that Practitioner's advice, recommendations, and/or decision may be based on factors not within their control, such as incomplete or inaccurate data provided by me or distortions of diagnostic images or specimens that may result from electronic transmissions. I  understand that the practice of medicine is not an exact science and that Practitioner makes no warranties or guarantees regarding treatment outcomes. I acknowledge that I will receive a copy of this consent concurrently upon execution via email to the email address I last provided but may also request a printed copy by calling the office of [facility].    I understand that my insurance will be billed for this visit.   I have read or had this consent read to me.  I understand the contents of this consent, which adequately explains the benefits and risks of the Services being provided via telemedicine.   I have been provided ample opportunity to ask questions regarding this consent and the Services and have had  my questions answered to my satisfaction.  I give my informed consent for the services to be provided through the use of telemedicine in my medical care  By participating in this telemedicine visit I agree to the above.

## 2018-11-11 NOTE — Telephone Encounter (Signed)

## 2018-11-12 ENCOUNTER — Ambulatory Visit (INDEPENDENT_AMBULATORY_CARE_PROVIDER_SITE_OTHER): Payer: Medicare HMO

## 2018-11-12 ENCOUNTER — Telehealth: Payer: Self-pay | Admitting: *Deleted

## 2018-11-12 ENCOUNTER — Other Ambulatory Visit: Payer: Self-pay

## 2018-11-12 DIAGNOSIS — I059 Rheumatic mitral valve disease, unspecified: Secondary | ICD-10-CM

## 2018-11-12 DIAGNOSIS — I340 Nonrheumatic mitral (valve) insufficiency: Secondary | ICD-10-CM

## 2018-11-12 NOTE — Telephone Encounter (Signed)
-----   Message from Satira Sark, MD sent at 11/12/2018  2:56 PM EDT ----- Results reviewed.  LVEF normal at 60 to 65%.  Mitral regurgitation is mild to moderate range and tricuspid regurgitation is mild range.  Overall stable or improved findings.  Continue with current follow-up plan.

## 2018-11-12 NOTE — Telephone Encounter (Signed)
Patient informed. Copy sent to PCP °

## 2018-11-14 ENCOUNTER — Encounter: Payer: Self-pay | Admitting: Cardiology

## 2018-11-14 ENCOUNTER — Telehealth (INDEPENDENT_AMBULATORY_CARE_PROVIDER_SITE_OTHER): Payer: Medicare HMO | Admitting: Cardiology

## 2018-11-14 VITALS — Ht 66.5 in

## 2018-11-14 DIAGNOSIS — I10 Essential (primary) hypertension: Secondary | ICD-10-CM

## 2018-11-14 DIAGNOSIS — I340 Nonrheumatic mitral (valve) insufficiency: Secondary | ICD-10-CM

## 2018-11-14 DIAGNOSIS — E782 Mixed hyperlipidemia: Secondary | ICD-10-CM | POA: Diagnosis not present

## 2018-11-14 DIAGNOSIS — Z7189 Other specified counseling: Secondary | ICD-10-CM

## 2018-11-14 DIAGNOSIS — I25119 Atherosclerotic heart disease of native coronary artery with unspecified angina pectoris: Secondary | ICD-10-CM

## 2018-11-14 NOTE — Patient Instructions (Addendum)
Medication Instructions:   Your physician recommends that you continue on your current medications as directed. Please refer to the Current Medication list given to you today.  Labwork:  NONE-will request lab work from your primary care doctor in August 2020.  Testing/Procedures:  NONE  Follow-Up:  Your physician recommends that you schedule a follow-up appointment in: 6 months. You will receive a reminder letter in the mail in about 4 months reminding you to call and schedule your appointment. If you don't receive this letter, please contact our office.  Any Other Special Instructions Will Be Listed Below (If Applicable).  If you need a refill on your cardiac medications before your next appointment, please call your pharmacy.

## 2018-11-14 NOTE — Progress Notes (Signed)
Virtual Visit via Telephone Note   This visit type was conducted due to national recommendations for restrictions regarding the COVID-19 Pandemic (e.g. social distancing) in an effort to limit this patient's exposure and mitigate transmission in our community.  Due to her co-morbid illnesses, this patient is at least at moderate risk for complications without adequate follow up.  This format is felt to be most appropriate for this patient at this time.  The patient did not have access to video technology/had technical difficulties with video requiring transitioning to audio format only (telephone).  All issues noted in this document were discussed and addressed.  No physical exam could be performed with this format.  Please refer to the patient's chart for her  consent to telehealth for [facility].   Date:  11/14/2018   ID:  Kathryn Powell, DOB [birth year], MRN [id number]  Patient Location: Home Provider Location: Office  PCP:  Sandi Mealy, MD  Cardiologist:  Rozann Lesches, MD Electrophysiologist:  None   Evaluation Performed:  Follow-Up Visit  Chief Complaint:   Cardiac follow-up  History of Present Illness:    Kathryn Powell is a 78 y.o. female last seen in December 2019.  She did not have video access and we spoke by phone today.  She states that she has been doing very well.  No angina symptoms or breathlessness beyond NYHA class I to class II.  She continues to exercise, has an elliptical machine that she uses for at least an hour at a time 3 days a week, also does a lot of outdoor work.  Recent follow-up echocardiogram showed normal LVEF at 60 to 65% with mild to moderate mitral regurgitation and mild tricuspid regurgitation. We discussed the results today.  Overall reassuring.  We discussed follow-up lab work.  She will have a physical with her PCP in August.  I went over her medications which are stable from a cardiac perspective.  The patient does not have  symptoms concerning for COVID-19 infection (fever, chills, cough, or new shortness of breath).    Past Medical History:  Diagnosis Date  . Colon cancer [facility])    Status post surgical resection  . Coronary artery disease    DES to LAD 04/2014 ([facility])  . Hypertension   . Hypothyroidism   . Mitral regurgitation    Past Surgical History:  Procedure Laterality Date  . ARTHRODESIS METATARSALPHALANGEAL JOINT (MTPJ) Left 02/02/2016   Procedure: ARTHRODESIS LEFT METATARSALPHALANGEAL JOINT (MTPJ);  Surgeon: Wylene Simmer, MD;  Location: [place];  Service: Orthopedics;  Laterality: Left;  . BREAST LUMPECTOMY     left  . BUNIONECTOMY     Bilateral  . CARDIAC VALVE SURGERY  Dec. 11,2015   heart stent placed per pt. Rosine Door  . COLON SURGERY  over 10 years ago  . FOOT SURGERY     with pins- right  . HAMMER TOE SURGERY Left 02/02/2016   Procedure: LEFT SECOND MALLET TOE CORRECTION;  Surgeon: Wylene Simmer, MD;  Location: [place];  Service: Orthopedics;  Laterality: Left;  . METATARSAL OSTEOTOMY Left 02/02/2016   Procedure: 2 - 3 WEIL METATARSAL OSTEOTOMY;  Surgeon: Wylene Simmer, MD;  Location: [place];  Service: Orthopedics;  Laterality: Left;  Marland Kitchen VAGINAL HYSTERECTOMY       Current Meds  Medication Sig  . aspirin EC 81 MG tablet Take 81 mg by mouth daily.  Marland Kitchen atorvastatin (LIPITOR) 40 MG tablet Take 1 tablet (40 mg total)  by mouth daily.  . calcium carbonate (OS-CAL - DOSED IN MG OF ELEMENTAL CALCIUM) 1250 MG tablet Take 1 tablet by mouth daily. 1000 mg  . cholecalciferol (VITAMIN D) 1000 UNITS tablet Take 1,000 Units by mouth daily.    . fish oil-omega-3 fatty acids 1000 MG capsule Take 1 g by mouth daily.   Marland Kitchen levothyroxine (SYNTHROID, LEVOTHROID) 100 MCG tablet Take 100 mcg by mouth daily.    . Magnesium 200 MG TABS Take 400 mg by mouth daily.  . metoprolol tartrate (LOPRESSOR) 25 MG tablet Take 25 mg by mouth 2 (two) times daily.    . Multiple  Vitamins-Minerals (MULTIVITAMIN,TX-MINERALS) tablet Take 1 tablet by mouth daily.    Marland Kitchen triamterene-hydrochlorothiazide (DYAZIDE) 37.5-25 MG per capsule Take 1 capsule by mouth every morning.    . [DISCONTINUED] aspirin (ASPIRIN EC) 81 MG EC tablet Take 1 tablet (81 mg total) by mouth 2 (two) times daily. (Patient taking differently: Take 81 mg by mouth daily. )     Allergies:   Lisinopril and Sulfa antibiotics   Social History   Tobacco Use  . Smoking status: Never Smoker  . Smokeless tobacco: Never Used  Substance Use Topics  . Alcohol use: Yes    Alcohol/week: 0.0 standard drinks    Comment: rare  . Drug use: No     Family Hx: The patient's family history includes Hypertension in her brother and mother. There is no history of Colon cancer.  ROS:   Please see the history of present illness. All other systems reviewed and are negative.   Prior CV studies:   The following studies were reviewed today:  Cardiac catheterization and PCI12/03/2014 ([facility]): Native Coronary Anatomy 1. Left Main: Medium caliber vessel without significant disease. 2. LAD: Medium caliber vessel. Proximally free disease. Midportion had 60% followed by a 90% lesion noted. Remainder was free disease. One small to medium-size diagonal noted. 3. Circumflex: Medium-size nondominant. 2 major obtuse marginals without significant disease. 4. Right Coronary Artery: Medium-size and dominant. Midportion had some mild 20% disease. Right PDA and PLV branches free disease. 5. Hemodynamics: Aortic pressure 154/70 with a mean of 100  Equipment Used: 1. 6 French XB 3.5 guide without sideholes 2. Short BMW wire 3. 2.5 x 15 mm trek balloon 4. 3.0 x 23 mm Alpine drug-eluting stent 5. 3.0 x 15 mm [place] trek balloon  Medications Used: 1. Unfractionated heparin IV 2. Effient 60 mg orally. 3. Intracoronary nitroglycerin  Description of Coronary Intervention: After diagnostic angiograms were performed a 6 [place] guide  was brought up into the ostium of the left main and provide adequate backup support. The LAD was wired with a short BMW wire. Initially the mid LAD lesion was predilated with a 2.5 x 15 mm trek  balloon.  Next a 3.0 x 23 mm Alpine drug-eluting stent was delivered and deployed in the mid LAD. The stent was then postdilated in its entirety at high pressure with a 3.0 x 15 mm [place] trek balloon.  Final angiograms show no evidence of stent malposition, vessel perforation, or dissection. There was apparent jailing of the diagonal side branch with slow TIMI 2 flow down this branch but this appear to be coming back.  With the flow being restored the decision was made not to do any further intervention to this vessel. The patient was chest pain-free.  A TR band was placed in the right wrist for hemostasis once all equipment was removed from the body.  Conclusions: 1. Single vessel obstructive  coronary artery disease 2. PCI to the mid LAD with a 3.0 x 23 mm Alpine drug-eluting stent. Lesion length was 18 mm mild calcium no thrombus. Lesion went from 90% pre-to 0% post residual. Pre-TIMI flow is 3, post TIMI flow is 3 3. Right radial access.  Echocardiogram 11/12/2018:  1. The left ventricle has normal systolic function with an ejection fraction of 60-65%. The cavity size was normal. Left ventricular diastolic Doppler parameters are consistent with pseudonormalization. No evidence of left ventricular regional wall  motion abnormalities.  2. The right ventricle has normal systolic function. The cavity was normal. There is no increase in right ventricular wall thickness. Right ventricular systolic pressure estimated at 25.7 mmHg.  3. The aortic valve is tricuspid. Aortic valve regurgitation is trivial by color flow Doppler. Mild aortic annular calcification noted.  4. The mitral valve is grossly normal. There is mild mitral annular calcification present. Mitral valve regurgitation is mild to moderate by color  flow Doppler. The MR jet is posteriorly-directed.  5. The tricuspid valve is grossly normal.  6. The aortic root is normal in size and structure.  Labs/Other Tests and Data Reviewed:    EKG:  An ECG dated 11/07/2017 was personally reviewed today and demonstrated:  Sinus rhythm with nonspecific T wave changes and PVC.  Recent Labs:  Pending lab work in August with PCP.  Wt Readings from Last 3 Encounters:  05/09/18 184 lb (83.5 kg)  11/07/17 174 lb (78.9 kg)  02/02/16 184 lb (83.5 kg)     Objective:    Vital Signs:  Ht 5' 6.5" (1.689 m)   BMI 29.25 kg/m    Patient did not have a way to check vital signs today. Patient spoke in full sentences, not short of breath. No audible wheezing or coughing. Speech pattern normal.  ASSESSMENT & PLAN:    1.  Status post DES to the mid LAD in November 2015.  She is doing well without angina symptoms and exercising regularly.  Continue medical therapy and observation.  2.  Valvular heart disease, mild to moderate mitral and mild tricuspid regurgitation by recent assessment.  She is asymptomatic.  3.  Mixed hyperlipidemia on Lipitor.  She will have follow-up lab work with Dr. Lorra Hals in August.  4.  Essential hypertension, tolerating Lopressor and Dyazide.  Keep follow-up with Dr. Lorra Hals.  COVID-19 Education: The signs and symptoms of COVID-19 were discussed with the patient and how to seek care for testing (follow up with PCP or arrange E-visit).  The importance of social distancing was discussed today.  Time:   Today, I have spent 7 minutes with the patient with telehealth technology discussing the above problems.     Medication Adjustments/Labs and Tests Ordered: Current medicines are reviewed at length with the patient today.  Concerns regarding medicines are outlined above.   Tests Ordered: No orders of the defined types were placed in this encounter.   Medication Changes: No orders of the defined types were placed in this  encounter.   Follow Up:  In Person 6 months in the [place] office.  Signed, Rozann Lesches, MD  11/14/2018 9:43 AM    [place]

## 2018-11-20 ENCOUNTER — Other Ambulatory Visit: Payer: Self-pay | Admitting: Cardiology

## 2019-01-17 ENCOUNTER — Encounter: Payer: Self-pay | Admitting: Cardiology

## 2019-01-23 ENCOUNTER — Encounter: Payer: Self-pay | Admitting: *Deleted

## 2019-05-07 ENCOUNTER — Telehealth (INDEPENDENT_AMBULATORY_CARE_PROVIDER_SITE_OTHER): Payer: Medicare HMO | Admitting: Family Medicine

## 2019-05-07 ENCOUNTER — Encounter: Payer: Self-pay | Admitting: Cardiology

## 2019-05-07 VITALS — Ht 66.5 in | Wt 168.0 lb

## 2019-05-07 DIAGNOSIS — E782 Mixed hyperlipidemia: Secondary | ICD-10-CM | POA: Diagnosis not present

## 2019-05-07 DIAGNOSIS — Z7982 Long term (current) use of aspirin: Secondary | ICD-10-CM

## 2019-05-07 DIAGNOSIS — Z79899 Other long term (current) drug therapy: Secondary | ICD-10-CM

## 2019-05-07 DIAGNOSIS — I2510 Atherosclerotic heart disease of native coronary artery without angina pectoris: Secondary | ICD-10-CM

## 2019-05-07 DIAGNOSIS — I10 Essential (primary) hypertension: Secondary | ICD-10-CM | POA: Diagnosis not present

## 2019-05-07 DIAGNOSIS — I340 Nonrheumatic mitral (valve) insufficiency: Secondary | ICD-10-CM

## 2019-05-07 MED ORDER — ATORVASTATIN CALCIUM 40 MG PO TABS: 40.0000 mg | ORAL_TABLET | Freq: Every day | ORAL | 2 refills | Status: DC

## 2019-05-07 NOTE — Progress Notes (Addendum)
Virtual Visit via Telephone Note   This visit type was conducted due to national recommendations for restrictions regarding the COVID-19 Pandemic (e.g. social distancing) in an effort to limit this patient's exposure and mitigate transmission in our community.  Due to her co-morbid illnesses, this patient is at least at moderate risk for complications without adequate follow up.  This format is felt to be most appropriate for this patient at this time.  The patient did not have access to video technology/had technical difficulties with video requiring transitioning to audio format only (telephone).  All issues noted in this document were discussed and addressed.  No physical exam could be performed with this format.  Please refer to the patient's chart for her  consent to telehealth for Kathryn Powell.   Date:  05/07/2019   ID:  Kathryn Powell, Kathryn Powell 06-22-1940, MRN [id number]  Patient Location: Home Provider Location: Office  PCP:  Sandi Mealy, MD  Cardiologist:  Rozann Lesches, MD  Electrophysiologist:  None   Evaluation Performed:  Follow-Up Visit  Chief Complaint: 1 year follow-up for coronary artery disease, hypertension, atrial regurgitation  History of Present Illness:    Kathryn Powell is a 78 y.o. female with last seen by Dr. Domenic Polite in the office in December on the 13th 2019.   No recent acute illnesses, hospitalizations, surgeries, travels.  She continues to to use her elliptical and exercising a few times a week without incident.  Denies any progressive anginal or exertional symptoms.  States she has lost approximately 15 pounds over the last year.  Recently saw her PCP and September for an annual wellness visit.  Repeat echocardiogram in June showed ejection fraction of 60 to 65%.  Doppler parameters were consistent with pseudonormalization.  Previous echo showed grade 2 diastolic dysfunction.  Mitral regurgitation was mild to moderate.  There was no mention of  tricuspid regurgitation on recent echo as compared to previous echo..  The patient does not have symptoms concerning for COVID-19 infection (fever, chills, cough, or new shortness of breath).   Medications reviewed with patient.  She has had no medication changes since last visit.  Past Medical History:  Diagnosis Date  . Colon cancer [facility])    Status post surgical resection  . Coronary artery disease    DES to LAD 04/2014 ([facility])  . Hypertension   . Hypothyroidism   . Mitral regurgitation    Past Surgical History:  Procedure Laterality Date  . ARTHRODESIS METATARSALPHALANGEAL JOINT (MTPJ) Left 02/02/2016   Procedure: ARTHRODESIS LEFT METATARSALPHALANGEAL JOINT (MTPJ);  Surgeon: Wylene Simmer, MD;  Location: [place];  Service: Orthopedics;  Laterality: Left;  . BREAST LUMPECTOMY     left  . BUNIONECTOMY     Bilateral  . CARDIAC VALVE SURGERY  Dec. 11,2015   heart stent placed per pt. Rosine Door  . COLON SURGERY  over 10 years ago  . FOOT SURGERY     with pins- right  . HAMMER TOE SURGERY Left 02/02/2016   Procedure: LEFT SECOND MALLET TOE CORRECTION;  Surgeon: Wylene Simmer, MD;  Location: [place];  Service: Orthopedics;  Laterality: Left;  . METATARSAL OSTEOTOMY Left 02/02/2016   Procedure: 2 - 3 WEIL METATARSAL OSTEOTOMY;  Surgeon: Wylene Simmer, MD;  Location: [place];  Service: Orthopedics;  Laterality: Left;  Marland Kitchen VAGINAL HYSTERECTOMY       Current Meds  Medication Sig  . aspirin EC 81 MG tablet Take 81 mg by mouth daily.  Marland Kitchen  atorvastatin (LIPITOR) 40 MG tablet Take 1 tablet by mouth once daily  . calcium carbonate (OS-CAL - DOSED IN MG OF ELEMENTAL CALCIUM) 1250 MG tablet Take 1 tablet by mouth daily. 1000 mg  . cholecalciferol (VITAMIN D) 1000 UNITS tablet Take 1,000 Units by mouth daily.    . fish oil-omega-3 fatty acids 1000 MG capsule Take 1 g by mouth daily.   Laurina Bustle Oil 80 MG CAPS Take 1 capsule by mouth at bedtime.  Marland Kitchen  levothyroxine (SYNTHROID, LEVOTHROID) 100 MCG tablet Take 100 mcg by mouth daily.    . Magnesium 200 MG TABS Take 400 mg by mouth daily.  Marland Kitchen MELATONIN-CHAMOMILE PO Take 1 tablet by mouth at bedtime.  . metoprolol tartrate (LOPRESSOR) 25 MG tablet Take 25 mg by mouth 2 (two) times daily.    . Multiple Vitamins-Minerals (MULTIVITAMIN,TX-MINERALS) tablet Take 1 tablet by mouth daily.    Marland Kitchen triamterene-hydrochlorothiazide (DYAZIDE) 37.5-25 MG per capsule Take 1 capsule by mouth every morning.       Allergies:   Lisinopril and Sulfa antibiotics   Social History   Tobacco Use  . Smoking status: Never Smoker  . Smokeless tobacco: Never Used  Substance Use Topics  . Alcohol use: Yes    Alcohol/week: 0.0 standard drinks    Comment: rare  . Drug use: No     Family Hx: The patient's family history includes Hypertension in her brother and mother. There is no history of Colon cancer.  ROS:   Please see the history of present illness.    All other systems reviewed and are negative.   Prior CV studies:   The following studies were reviewed today:  Cardiac catheterization and PCI12/03/2014 ([facility]): Native Coronary Anatomy 1. Left Main: Medium caliber vessel without significant disease. 2. LAD: Medium caliber vessel. Proximally free disease. Midportion had 60% followed by a 90% lesion noted. Remainder was free disease. One small to medium-size diagonal noted. 3. Circumflex: Medium-size nondominant. 2 major obtuse marginals without significant disease. 4. Right Coronary Artery: Medium-size and dominant. Midportion had some mild 20% disease. Right PDA and PLV branches free disease. 5. Hemodynamics: Aortic pressure 154/70 with a mean of 100  Equipment Used: 1. 6 French XB 3.5 guide without sideholes 2. Short BMW wire 3. 2.5 x 15 mm trek balloon 4. 3.0 x 23 mm Alpine drug-eluting stent 5. 3.0 x 15 mm [place] trek balloon  Medications Used: 1. Unfractionated heparin IV 2. Effient 60 mg  orally. 3. Intracoronary nitroglycerin  Description of Coronary Intervention: After diagnostic angiograms were performed a 6 [place] guide was brought up into the ostium of the left main and provide adequate backup support. The LAD was wired with a short BMW wire. Initially the mid LAD lesion was predilated with a 2.5 x 15 mm trek  balloon.  Next a 3.0 x 23 mm Alpine drug-eluting stent was delivered and deployed in the mid LAD. The stent was then postdilated in its entirety at high pressure with a 3.0 x 15 mm [place] trek balloon.  Final angiograms show no evidence of stent malposition, vessel perforation, or dissection. There was apparent jailing of the diagonal side branch with slow TIMI 2 flow down this branch but this appear to be coming back.  With the flow being restored the decision was made not to do any further intervention to this vessel. The patient was chest pain-free.  A TR band was placed in the right wrist for hemostasis once all equipment was removed from the body.  Conclusions: 1. Single vessel obstructive coronary artery disease 2. PCI to the mid LAD with a 3.0 x 23 mm Alpine drug-eluting stent. Lesion length was 18 mm mild calcium no thrombus. Lesion went from 90% pre-to 0% post residual. Pre-TIMI flow is 3, post TIMI flow is 3 3. Right radial access.   Pulmonary arteries: PA peak pressure: 37 mm Hg (S).  Echocardiogram on November 12, 2018 IMPRESSIONS   1. The left ventricle has normal systolic function with an ejection fraction of 60-65%. The cavity size was normal. Left ventricular diastolic Doppler parameters are consistent with pseudonormalization. No evidence of left ventricular regional wall  motion abnormalities.  2. The right ventricle has normal systolic function. The cavity was normal. There is no increase in right ventricular wall thickness. Right ventricular systolic pressure estimated at 25.7 mmHg.  3. The aortic valve is tricuspid. Aortic valve regurgitation is  trivial by color flow Doppler. Mild aortic annular calcification noted.  4. The mitral valve is grossly normal. There is mild mitral annular calcification present. Mitral valve regurgitation is mild to moderate by color flow Doppler. The MR jet is posteriorly-directed.  5. The tricuspid valve is grossly normal.  6. The aortic root is normal in size and structure.  FINDINGS  Left Ventricle: The left ventricle has normal systolic function, with an ejection fraction of 60-65%. The cavity size was normal. There is no increase in left ventricular wall thickness. Left ventricular diastolic Doppler parameters are consistent with  pseudonormalization. No evidence of left ventricular regional wall motion abnormalities..  Labs/Other Tests and Data Reviewed:    EKG:  An ECG dated November 07, 2017 was personally reviewed today and demonstrated:  Sinus rhythm 68 occasional PVC  Recent Labs: No results found for requested labs within last 8760 hours.  Lab results dated January 17, 2018 showed hemoglobin of 14.7 hematocrit 44.2, platelets 217, free T4 1.42 TSH 3.2, cholesterol total 190, triglycerides 141, HDL 93, LDL 69.  AST 19, ALT 20 Recent Lipid Panel No results found for: CHOL, TRIG, HDL, CHOLHDL, LDLCALC, LDLDIRECT  Wt Readings from Last 3 Encounters:  05/07/19 168 lb (76.2 kg)  05/09/18 184 lb (83.5 kg)  11/07/17 174 lb (78.9 kg)     Objective:    Vital Signs:  Ht 5' 6.5" (1.689 m)   Wt 168 lb (76.2 kg)   BMI 26.71 kg/m    Speaking in complete sentences with normal speech pattern.  No evidence of cough, shortness of breath, or dyspnea noted.    ASSESSMENT & PLAN:    1. CAD in native artery Denies any progressive anginal or exertional symptoms.  States she is exercising and using her elliptical machine 3-4 times a week without any issues.Continue aspirin 81 mg, atorvastatin 40 mg,  Metoprolol 25 mg  2. Non-rheumatic mitral regurgitation Recent echocardiogram showed mild mitral annular  calcification with mild to moderate regurgitation with posteriorly directed jet.  3. Mixed hyperlipidemia Continues on atorvastatin 40 mg.  Recent LDL was 69 on labs in August  4. Essential hypertension No recorded blood pressure today.  Patient states her blood pressure is normal at PCP office.  Continue Dyazide 37.5/25 mg daily.  COVID-19 Education: The signs and symptoms of COVID-19 were discussed with the patient and how to seek care for testing (follow up with PCP or arrange E-visit).  The importance of social distancing was discussed today.  Time:   Today, I have spent 15  minutes with the patient with telehealth technology discussing the above problems.  Medication Adjustments/Labs and Tests Ordered: Current medicines are reviewed at length with the patient today.  Concerns regarding medicines are outlined above.   Tests Ordered: No orders of the defined types were placed in this encounter.   Medication Changes: No orders of the defined types were placed in this encounter.   Follow Up:  Either In Person or Virtual in 1 year(s)  Signed, Verta Ellen, NP  05/07/2019 8:36 AM    [place]

## 2019-05-07 NOTE — Patient Instructions (Addendum)
Medication Instructions:   Your physician recommends that you continue on your current medications as directed. Please refer to the Current Medication list given to you today.  Labwork:  NONE  Testing/Procedures:  NONE  Follow-Up:  Your physician recommends that you schedule a follow-up appointment in: 1 year in the office or virtual. You will receive a reminder letter in the mail in about 10 months reminding you to call and schedule your appointment. If you don't receive this letter, please contact our office.  Any Other Special Instructions Will Be Listed Below (If Applicable).  If you need a refill on your cardiac medications before your next appointment, please call your pharmacy.

## 2019-06-01 ENCOUNTER — Other Ambulatory Visit: Payer: Self-pay | Admitting: Cardiology

## 2019-06-01 DIAGNOSIS — C189 Malignant neoplasm of colon, unspecified: Secondary | ICD-10-CM

## 2019-06-01 DIAGNOSIS — E782 Mixed hyperlipidemia: Secondary | ICD-10-CM

## 2019-06-01 MED ORDER — TRIAMTERENE-HCTZ 37.5-25 MG PO CAPS: 1.0000 | ORAL_CAPSULE | ORAL | 2 refills | Status: DC

## 2019-06-01 MED ORDER — ATORVASTATIN CALCIUM 40 MG PO TABS: 40.0000 mg | ORAL_TABLET | Freq: Every day | ORAL | 2 refills | Status: DC

## 2019-06-01 MED ORDER — METOPROLOL TARTRATE 25 MG PO TABS: 25.0000 mg | ORAL_TABLET | Freq: Two times a day (BID) | ORAL | 2 refills | Status: DC

## 2019-06-01 NOTE — Telephone Encounter (Signed)
Noted and sent.  

## 2019-06-01 NOTE — Telephone Encounter (Signed)
Switching pharmacies to [facility] Mail order. Needs all cardiac medications sent to new pharmacy  Phone [phone] Fax  [phone]

## 2019-10-06 ENCOUNTER — Other Ambulatory Visit (HOSPITAL_COMMUNITY): Payer: Self-pay | Admitting: Family Medicine

## 2019-10-06 DIAGNOSIS — Z1231 Encounter for screening mammogram for malignant neoplasm of breast: Secondary | ICD-10-CM

## 2019-11-09 ENCOUNTER — Other Ambulatory Visit: Payer: Self-pay

## 2019-11-09 ENCOUNTER — Ambulatory Visit (HOSPITAL_COMMUNITY)
Admission: RE | Admit: 2019-11-09 | Discharge: 2019-11-09 | Disposition: A | Discharge status: Home / Self Care | Payer: Medicare Other | Source: Ambulatory Visit | Attending: Family Medicine | Admitting: Family Medicine

## 2019-11-09 DIAGNOSIS — Z1231 Encounter for screening mammogram for malignant neoplasm of breast: Secondary | ICD-10-CM

## 2019-12-28 ENCOUNTER — Encounter: Payer: Self-pay | Admitting: Dermatology

## 2019-12-28 ENCOUNTER — Other Ambulatory Visit: Payer: Self-pay

## 2019-12-28 ENCOUNTER — Ambulatory Visit: Payer: Medicare Other | Admitting: Dermatology

## 2019-12-28 DIAGNOSIS — L821 Other seborrheic keratosis: Secondary | ICD-10-CM

## 2019-12-28 DIAGNOSIS — D485 Neoplasm of uncertain behavior of skin: Secondary | ICD-10-CM

## 2019-12-28 DIAGNOSIS — L814 Other melanin hyperpigmentation: Secondary | ICD-10-CM

## 2019-12-28 DIAGNOSIS — D229 Melanocytic nevi, unspecified: Secondary | ICD-10-CM

## 2019-12-28 DIAGNOSIS — D489 Neoplasm of uncertain behavior, unspecified: Secondary | ICD-10-CM

## 2019-12-28 NOTE — Patient Instructions (Addendum)
Biopsy, Surgery (Curettage) & Surgery (Excision) Aftercare Instructions  1. Okay to remove bandage in 24 hours  2. Wash area with soap and water  3. Apply Vaseline to area twice daily until healed (Not Neosporin)  4. Okay to cover with a Band-Aid to decrease the chance of infection or prevent irritation from clothing; also it's okay to uncover lesion at home.  5. Suture instructions: return to our office in 7-10 or 10-14 days for a nurse visit for suture removal. Variable healing with sutures, if pain or itching occurs call our office. It's okay to shower or bathe 24 hours after sutures are given.  6. The following risks may occur after a biopsy, curettage or excision: bleeding, scarring, discoloration, recurrence, infection (redness, yellow drainage, pain or swelling).  7. For questions, concerns and results call our office at [place] before 4pm & Friday before 3pm. Biopsy results will be available in 1 week.  General skin check for [employer]  She diligently tried using the [place] on the right side of the nose but there remains a pearly 3 mm pink bump, easier to find with the fingernail then with an eye.  This could be either a fibrous papule of the nose or a low risk nonmelanoma skin cancer.  Shave biopsy obtained and Kathryn Powell can call the office in 3 days for this result.  The remainder of the examination from scalp down to waist and lower legs showed no atypical moles or skin cancer.  There is recurrent pigmentation next to a scar below the right rib cage but with dermoscopy it looks identical to all her other moles.  When another doctor called age spots are benign keratoses scattered mainly on the torso.  She does have a solar lentigo (flat tan spot) on the left cheekbone.  We discussed cosmetic options for this including referral to doctors who do laser surgery.  All her questions were addressed.

## 2019-12-31 ENCOUNTER — Telehealth: Payer: Self-pay | Admitting: Dermatology

## 2019-12-31 NOTE — Telephone Encounter (Signed)
Pathology given to patient 

## 2019-12-31 NOTE — Telephone Encounter (Signed)
Results

## 2020-02-05 ENCOUNTER — Encounter: Payer: Self-pay | Admitting: Dermatology

## 2020-02-05 NOTE — Progress Notes (Signed)
   Follow-Up Visit   Subjective  Kathryn Powell is a 79 y.o. female who presents for the following: Annual Exam (right nose previous tx cream PICATO 2020).  Growth Location: Nose Duration:  Quality: Persistent Associated Signs/Symptoms: Modifying Factors: Picato failed Severity:  Timing: Context:   Objective  Well appearing patient in no apparent distress; mood and affect are within normal limits.  All sun exposed areas plus back examined.   Assessment & Plan    Neoplasm of uncertain behavior Right SIDE OF NOSE  Skin / nail biopsy Type of biopsy: tangential   Informed consent: discussed and consent obtained   Timeout: patient name, date of birth, surgical site, and procedure verified   Procedure prep:  Patient was prepped and draped in usual sterile fashion Prep type:  Chlorhexidine Anesthesia: the lesion was anesthetized in a standard fashion   Anesthetic:  1% lidocaine w/ epinephrine 1-100,000 local infiltration Instrument used: flexible razor blade   Hemostasis achieved with: ferric subsulfate   Outcome: patient tolerated procedure well   Post-procedure details: wound care instructions given    Specimen 1 - Surgical pathology Differential Diagnosis: BCC SCC Check Margins: No General skin check for Kathryn Powell.  She diligently tried using the Picato on the right side of the nose but there remains a pearly 3 mm pink bump, easier to find with the fingernail then with an eye.  This could be either a fibrous papule of the nose or a low risk nonmelanoma skin cancer.  Shave biopsy obtained and Kathryn Powell can call the office in 3 days for this result.  The remainder of the examination from scalp down to waist and lower legs showed no atypical moles or skin cancer.  There is recurrent pigmentation next to a scar below the right rib cage but with dermoscopy it looks identical to all her other moles.  When another doctor called age spots are benign keratoses scattered mainly  on the torso.  She does have a solar lentigo (flat tan spot) on the left cheekbone.  We discussed cosmetic options for this including referral to doctors who do laser surgery.  All her questions were addressed.    I, Lavonna Monarch, MD, have reviewed all documentation for this visit.  The documentation on 02/05/20 for the exam, diagnosis, procedures, and orders are all accurate and complete.

## 2020-02-08 ENCOUNTER — Other Ambulatory Visit: Payer: Self-pay | Admitting: Cardiology

## 2020-02-08 DIAGNOSIS — C189 Malignant neoplasm of colon, unspecified: Secondary | ICD-10-CM

## 2020-02-08 DIAGNOSIS — E782 Mixed hyperlipidemia: Secondary | ICD-10-CM

## 2020-04-14 ENCOUNTER — Other Ambulatory Visit (HOSPITAL_COMMUNITY): Payer: Self-pay | Admitting: Sports Medicine

## 2020-04-14 DIAGNOSIS — M25571 Pain in right ankle and joints of right foot: Secondary | ICD-10-CM

## 2020-04-28 ENCOUNTER — Other Ambulatory Visit: Payer: Self-pay

## 2020-04-28 ENCOUNTER — Ambulatory Visit (HOSPITAL_COMMUNITY)
Admission: RE | Admit: 2020-04-28 | Discharge: 2020-04-28 | Disposition: A | Discharge status: Home / Self Care | Payer: Medicare Other | Source: Ambulatory Visit | Attending: Sports Medicine | Admitting: Sports Medicine

## 2020-04-28 DIAGNOSIS — M25571 Pain in right ankle and joints of right foot: Secondary | ICD-10-CM | POA: Insufficient documentation

## 2020-05-16 ENCOUNTER — Encounter: Payer: Self-pay | Admitting: Cardiology

## 2020-05-16 ENCOUNTER — Ambulatory Visit: Payer: Medicare Other | Admitting: Cardiology

## 2020-05-16 VITALS — BP 140/88 | HR 73 | Ht 66.5 in | Wt 182.4 lb

## 2020-05-16 DIAGNOSIS — I25119 Atherosclerotic heart disease of native coronary artery with unspecified angina pectoris: Secondary | ICD-10-CM

## 2020-05-16 DIAGNOSIS — E782 Mixed hyperlipidemia: Secondary | ICD-10-CM

## 2020-05-16 NOTE — Progress Notes (Signed)
Cardiology Office Note  Date: 05/16/2020   ID: Claudine, Stallings 1940-10-02, MRN [id number]  PCP:  Leeanne Rio, MD  Cardiologist:  Rozann Lesches, MD Electrophysiologist:  None   Chief Complaint  Patient presents with  . Cardiac follow-up    History of Present Illness: Kathryn Powell is a 79 y.o. female last assessed via telehealth encounter in December 2020 by Mr. Leonides Sake NP.  She presents for a routine visit.  Reports no angina symptoms or unusual breathlessness/fatigue with typical ADLs.  She did fracture her right ankle back in the summer, this has healed and she is now back exercising.  She uses an elliptical seen for 45 minutes, 3 days a week.  I reviewed her recent lab work as noted below.  I reviewed her medications.  She reports no intolerances.  Most recent LDL was 63.  I personally reviewed her ECG which shows normal sinus rhythm.  Past Medical History:  Diagnosis Date  . Colon cancer [facility])    Status post surgical resection  . Coronary artery disease    DES to LAD 04/2014 ([facility])  . Hypertension   . Hypothyroidism   . Mitral regurgitation     Past Surgical History:  Procedure Laterality Date  . ARTHRODESIS METATARSALPHALANGEAL JOINT (MTPJ) Left 02/02/2016   Procedure: ARTHRODESIS LEFT METATARSALPHALANGEAL JOINT (MTPJ);  Surgeon: Wylene Simmer, MD;  Location: [place];  Service: Orthopedics;  Laterality: Left;  . BREAST LUMPECTOMY     left  . BUNIONECTOMY     Bilateral  . CARDIAC VALVE SURGERY  Dec. 11,2015   heart stent placed per pt. Rosine Door  . COLON SURGERY  over 10 years ago  . FOOT SURGERY     with pins- right  . HAMMER TOE SURGERY Left 02/02/2016   Procedure: LEFT SECOND MALLET TOE CORRECTION;  Surgeon: Wylene Simmer, MD;  Location: [place];  Service: Orthopedics;  Laterality: Left;  . METATARSAL OSTEOTOMY Left 02/02/2016   Procedure: 2 - 3 WEIL METATARSAL OSTEOTOMY;  Surgeon: Wylene Simmer, MD;  Location: [place];  Service: Orthopedics;  Laterality: Left;  Marland Kitchen VAGINAL HYSTERECTOMY      Current Outpatient Medications  Medication Sig Dispense Refill  . aspirin EC 81 MG tablet Take 81 mg by mouth daily.    Marland Kitchen atorvastatin (LIPITOR) 40 MG tablet TAKE 1 TABLET BY MOUTH  DAILY 90 tablet 3  . calcium carbonate (OS-CAL - DOSED IN MG OF ELEMENTAL CALCIUM) 1250 MG tablet Take 1 tablet by mouth daily. 1000 mg    . cholecalciferol (VITAMIN D) 1000 UNITS tablet Take 1,000 Units by mouth daily.    . fish oil-omega-3 fatty acids 1000 MG capsule Take 1 g by mouth daily.     Marland Kitchen levothyroxine (SYNTHROID, LEVOTHROID) 100 MCG tablet Take 100 mcg by mouth daily.    . Magnesium 200 MG TABS Take 400 mg by mouth daily.    Marland Kitchen MELATONIN-CHAMOMILE PO Take 1 tablet by mouth at bedtime.    . metoprolol tartrate (LOPRESSOR) 25 MG tablet TAKE 1 TABLET BY MOUTH  TWICE DAILY 180 tablet 3  . Multiple Vitamins-Minerals (MULTIVITAMIN,TX-MINERALS) tablet Take 1 tablet by mouth daily.     No current facility-administered medications for this visit.   Allergies:  Lisinopril and Sulfa antibiotics   ROS: No palpitations or syncope.  Physical Exam: VS:  BP 140/88   Pulse 73   Ht 5' 6.5" (1.689 m)   Wt 182 lb 6.4 oz (  82.7 kg)   SpO2 99%   BMI 29.00 kg/m , BMI Body mass index is 29 kg/m.  Wt Readings from Last 3 Encounters:  05/16/20 182 lb 6.4 oz (82.7 kg)  05/07/19 168 lb (76.2 kg)  05/09/18 184 lb (83.5 kg)    General: Patient appears comfortable at rest. HEENT: Conjunctiva and lids normal, wearing a mask. Neck: Supple, no elevated JVP or carotid bruits, no thyromegaly. Lungs: Clear to auscultation, nonlabored breathing at rest. Cardiac: Regular rate and rhythm, no S3 or significant systolic murmur, no pericardial rub. Extremities: No pitting edema.  ECG:  An ECG dated 11/07/2017 was personally reviewed today and demonstrated:  Sinus rhythm with nonspecific T wave changes and PVC.  Recent  Labwork:  November 2021: Hemoglobin 15.0, platelets 234, BUN 11, creatinine 0.8, potassium 4.3, AST 22, ALT 19, hemoglobin A1c 5.9%, cholesterol 274, triglycerides 112, HDL 92, LDL 63, TSH 1.29  Other Studies Reviewed Today:  Cardiac catheterization and PCI12/03/2014 ([facility]): Native Coronary Anatomy 1. Left Main: Medium caliber vessel without significant disease. 2. LAD: Medium caliber vessel. Proximally free disease. Midportion had 60% followed by a 90% lesion noted. Remainder was free disease. One small to medium-size diagonal noted. 3. Circumflex: Medium-size nondominant. 2 major obtuse marginals without significant disease. 4. Right Coronary Artery: Medium-size and dominant. Midportion had some mild 20% disease. Right PDA and PLV branches free disease. 5. Hemodynamics: Aortic pressure 154/70 with a mean of 100  Equipment Used: 1. 6 French XB 3.5 guide without sideholes 2. Short BMW wire 3. 2.5 x 15 mm trek balloon 4. 3.0 x 23 mm Alpine drug-eluting stent 5. 3.0 x 15 mm Lipan trek balloon  Medications Used: 1. Unfractionated heparin IV 2. Effient 60 mg orally. 3. Intracoronary nitroglycerin  Description of Coronary Intervention: After diagnostic angiograms were performed a 6 [place] guide was brought up into the ostium of the left main and provide adequate backup support. The LAD was wired with a short BMW wire. Initially the mid LAD lesion was predilated with a 2.5 x 15 mm trek  balloon.  Next a 3.0 x 23 mm Alpine drug-eluting stent was delivered and deployed in the mid LAD. The stent was then postdilated in its entirety at high pressure with a 3.0 x 15 mm Avenel trek balloon.  Final angiograms show no evidence of stent malposition, vessel perforation, or dissection. There was apparent jailing of the diagonal side branch with slow TIMI 2 flow down this branch but this appear to be coming back.  With the flow being restored the decision was made not to do any further intervention to  this vessel. The patient was chest pain-free.  A TR band was placed in the right wrist for hemostasis once all equipment was removed from the body.  Conclusions: 1. Single vessel obstructive coronary artery disease 2. PCI to the mid LAD with a 3.0 x 23 mm Alpine drug-eluting stent. Lesion length was 18 mm mild calcium no thrombus. Lesion went from 90% pre-to 0% post residual. Pre-TIMI flow is 3, post TIMI flow is 3 3. Right radial access.  Echocardiogram 11/12/2018: 1. The left ventricle has normal systolic function with an ejection fraction of 60-65%. The cavity size was normal. Left ventricular diastolic Doppler parameters are consistent with pseudonormalization. No evidence of left ventricular regional wall  motion abnormalities. 2. The right ventricle has normal systolic function. The cavity was normal. There is no increase in right ventricular wall thickness. Right ventricular systolic pressure estimated at 25.7 mmHg. 3. The  aortic valve is tricuspid. Aortic valve regurgitation is trivial by color flow Doppler. Mild aortic annular calcification noted. 4. The mitral valve is grossly normal. There is mild mitral annular calcification present. Mitral valve regurgitation is mild to moderate by color flow Doppler. The MR jet is posteriorly-directed. 5. The tricuspid valve is grossly normal. 6. The aortic root is normal in size and structure.  Assessment and Plan:  1.  CAD status post DES to the LAD in 2015.  She continues to do very well without active angina on medical therapy.  Tolerating exercise as noted above.  ECG reviewed and stable.  Continue observation on aspirin, Lipitor, and Lopressor.  We have discussed warning signs and symptoms that would prompt further ischemic assessment.  2.  Mixed hyperlipidemia, tolerating Lipitor.  Recent LDL 63.  Medication Adjustments/Labs and Tests Ordered: Current medicines are reviewed at length with the patient today.  Concerns regarding  medicines are outlined above.   Tests Ordered: Orders Placed This Encounter  Procedures  . EKG 12-Lead    Medication Changes: No orders of the defined types were placed in this encounter.   Disposition:  Follow up 1 year in the [place] office.  Signed, Satira Sark, MD, [facility] 05/16/2020 11:41 AM    [facility] [place], [place], [place] [address] Phone: [phone]; Fax: [phone]

## 2020-05-16 NOTE — Patient Instructions (Addendum)

## 2020-10-03 ENCOUNTER — Other Ambulatory Visit (HOSPITAL_COMMUNITY): Payer: Self-pay | Admitting: Family Medicine

## 2020-10-03 DIAGNOSIS — Z1231 Encounter for screening mammogram for malignant neoplasm of breast: Secondary | ICD-10-CM

## 2020-11-10 ENCOUNTER — Ambulatory Visit (HOSPITAL_COMMUNITY): Payer: Medicare Other

## 2020-11-16 ENCOUNTER — Ambulatory Visit (HOSPITAL_COMMUNITY)
Admission: RE | Admit: 2020-11-16 | Discharge: 2020-11-16 | Disposition: A | Discharge status: Home / Self Care | Payer: Medicare Other | Source: Ambulatory Visit | Attending: Family Medicine | Admitting: Family Medicine

## 2020-11-16 DIAGNOSIS — Z1231 Encounter for screening mammogram for malignant neoplasm of breast: Secondary | ICD-10-CM | POA: Diagnosis not present

## 2021-01-08 ENCOUNTER — Encounter: Payer: Self-pay | Admitting: Internal Medicine

## 2021-01-13 ENCOUNTER — Other Ambulatory Visit: Payer: Self-pay | Admitting: Cardiology

## 2021-01-13 DIAGNOSIS — C189 Malignant neoplasm of colon, unspecified: Secondary | ICD-10-CM

## 2021-01-13 DIAGNOSIS — E782 Mixed hyperlipidemia: Secondary | ICD-10-CM

## 2021-02-14 ENCOUNTER — Ambulatory Visit: Payer: Medicare Other | Admitting: Dermatology

## 2021-03-08 ENCOUNTER — Other Ambulatory Visit: Payer: Self-pay | Admitting: Internal Medicine

## 2021-03-08 ENCOUNTER — Other Ambulatory Visit: Payer: Self-pay

## 2021-03-08 ENCOUNTER — Ambulatory Visit (AMBULATORY_SURGERY_CENTER): Payer: Self-pay

## 2021-03-08 VITALS — Ht 66.5 in | Wt 166.0 lb

## 2021-03-08 DIAGNOSIS — Z85038 Personal history of other malignant neoplasm of large intestine: Secondary | ICD-10-CM

## 2021-03-08 MED ORDER — PLENVU 140 G PO SOLR: 1.0000 | ORAL | 0 refills | Status: DC

## 2021-03-08 NOTE — Progress Notes (Signed)
Patient is here in-person for PV. Patient denies any allergies to eggs or soy. Patient denies any problems with anesthesia/sedation. Patient is not on any oxygen at home. Patient is not taking any diet/weight loss medications or blood thinners. Patient is aware of our care-partner policy and FTDDU-20 safety protocol.   EMMI education assigned to the patient for the procedure, sent to [place].   Patient is COVID-19 vaccinated.  Plenvu  Prep Prescription coupon card  was given to the patient.

## 2021-03-08 NOTE — Addendum Note (Signed)
Addended by: Evonnie Pat A on: 03/08/2021 03:21 PM   Modules accepted: Orders

## 2021-03-09 ENCOUNTER — Encounter: Payer: Self-pay | Admitting: Internal Medicine

## 2021-03-17 ENCOUNTER — Telehealth: Payer: Self-pay | Admitting: Internal Medicine

## 2021-03-17 NOTE — Telephone Encounter (Signed)
Hey Dr. Hilarie Fredrickson,   Patient called in to cancel procedure for Wednesday 10/26 due to her OOP cost. Patient states she is changing insurance companies and will call back to reschedule for sometime in January.   Thank you

## 2021-03-22 ENCOUNTER — Encounter: Payer: Medicare Other | Admitting: Internal Medicine

## 2021-05-16 ENCOUNTER — Ambulatory Visit: Payer: Medicare Other | Admitting: Cardiology

## 2021-06-15 ENCOUNTER — Ambulatory Visit (AMBULATORY_SURGERY_CENTER): Payer: Medicare Other | Admitting: Internal Medicine

## 2021-06-15 ENCOUNTER — Encounter: Payer: Self-pay | Admitting: Internal Medicine

## 2021-06-15 VITALS — BP 127/61 | HR 61 | Temp 97.1°F | Resp 11 | Ht 66.0 in | Wt 166.0 lb

## 2021-06-15 DIAGNOSIS — Z85038 Personal history of other malignant neoplasm of large intestine: Secondary | ICD-10-CM | POA: Diagnosis not present

## 2021-06-15 MED ORDER — SODIUM CHLORIDE 0.9 % IV SOLN: 500.0000 mL | Freq: Once | INTRAVENOUS | Status: DC

## 2021-06-15 NOTE — Progress Notes (Signed)
Pt's states no medical or surgical changes since previsit or office visit. 

## 2021-06-15 NOTE — Progress Notes (Signed)
GASTROENTEROLOGY PROCEDURE H&P NOTE   Primary Care Physician: Leeanne Rio, MD    Reason for Procedure:  History of colorectal cancer  Plan:    Surveillance colonoscopy  Patient is appropriate for endoscopic procedure(s) in the ambulatory ([place]) setting.  The nature of the procedure, as well as the risks, benefits, and alternatives were carefully and thoroughly reviewed with the patient. Ample time for discussion and questions allowed. The patient understood, was satisfied, and agreed to proceed.     HPI: Kathryn Powell is a 81 y.o. female who presents for surveillance colonoscopy.  Tolerated the 2-day prep.  Medical history as below.  No recent chest pain, shortness of breath and no abdominal pain.   Past Medical History:  Diagnosis Date   Arthritis    Cancer [facility])    colon cancer, left breast cancer   Cataract    bil cataracts removed   Coronary artery disease    DES to LAD 04/2014 ([facility])   Hypertension    Hypothyroidism    Mitral regurgitation     Past Surgical History:  Procedure Laterality Date   ARTHRODESIS METATARSALPHALANGEAL JOINT (MTPJ) Left 02/02/2016   Procedure: ARTHRODESIS LEFT METATARSALPHALANGEAL JOINT (MTPJ);  Surgeon: Wylene Simmer, MD;  Location: [place];  Service: Orthopedics;  Laterality: Left;   BREAST LUMPECTOMY     left   BUNIONECTOMY     Bilateral   CARDIAC VALVE SURGERY  05/07/2014   heart stent placed per pt. /LAD   COLON SURGERY  over 10 years ago   COLONOSCOPY     FOOT SURGERY     with pins- right   HAMMER TOE SURGERY Left 02/02/2016   Procedure: LEFT SECOND MALLET TOE CORRECTION;  Surgeon: Wylene Simmer, MD;  Location: [place];  Service: Orthopedics;  Laterality: Left;   METATARSAL OSTEOTOMY Left 02/02/2016   Procedure: 2 - 3 WEIL METATARSAL OSTEOTOMY;  Surgeon: Wylene Simmer, MD;  Location: [place];  Service: Orthopedics;  Laterality: Left;   VAGINAL HYSTERECTOMY       Prior to Admission medications   Medication Sig Start Date End Date Taking? Authorizing Provider  aspirin EC 81 MG tablet Take 81 mg by mouth daily.   Yes [provider]  atorvastatin (LIPITOR) 40 MG tablet TAKE 1 TABLET BY MOUTH  DAILY 01/16/21  Yes Satira Sark, MD  calcium carbonate (OS-CAL - DOSED IN MG OF ELEMENTAL CALCIUM) 1250 MG tablet Take 1 tablet by mouth daily. 1000 mg   Yes [provider]  cholecalciferol (VITAMIN D) 1000 UNITS tablet Take 1,000 Units by mouth daily.   Yes [provider]  fish oil-omega-3 fatty acids 1000 MG capsule Take 1 g by mouth daily.    Yes [provider]  levothyroxine (SYNTHROID, LEVOTHROID) 100 MCG tablet Take 100 mcg by mouth daily.   Yes [provider]  Magnesium 200 MG TABS Take 400 mg by mouth daily.   Yes [provider]  MELATONIN-CHAMOMILE PO Take 1 tablet by mouth at bedtime.   Yes [provider]  metoprolol tartrate (LOPRESSOR) 25 MG tablet TAKE 1 TABLET BY MOUTH  TWICE DAILY 01/16/21  Yes Satira Sark, MD  Multiple Vitamins-Minerals (MULTIVITAMIN,TX-MINERALS) tablet Take 1 tablet by mouth daily.   Yes [provider]  triamterene-hydrochlorothiazide (DYAZIDE) 37.5-25 MG capsule Take 1 capsule by mouth daily. 11/19/20  Yes [provider]    Current Outpatient Medications  Medication Sig Dispense Refill   aspirin EC  81 MG tablet Take 81 mg by mouth daily.     atorvastatin (LIPITOR) 40 MG tablet TAKE 1 TABLET BY MOUTH  DAILY 90 tablet 3   calcium carbonate (OS-CAL - DOSED IN MG OF ELEMENTAL CALCIUM) 1250 MG tablet Take 1 tablet by mouth daily. 1000 mg     cholecalciferol (VITAMIN D) 1000 UNITS tablet Take 1,000 Units by mouth daily.     fish oil-omega-3 fatty acids 1000 MG capsule Take 1 g by mouth daily.      levothyroxine (SYNTHROID, LEVOTHROID) 100 MCG tablet Take 100 mcg by mouth daily.     Magnesium 200 MG TABS Take 400 mg by mouth daily.      MELATONIN-CHAMOMILE PO Take 1 tablet by mouth at bedtime.     metoprolol tartrate (LOPRESSOR) 25 MG tablet TAKE 1 TABLET BY MOUTH  TWICE DAILY 180 tablet 3   Multiple Vitamins-Minerals (MULTIVITAMIN,TX-MINERALS) tablet Take 1 tablet by mouth daily.     triamterene-hydrochlorothiazide (DYAZIDE) 37.5-25 MG capsule Take 1 capsule by mouth daily.     Current Facility-Administered Medications  Medication Dose Route Frequency Provider Last Rate Last Admin   0.9 %  sodium chloride infusion  500 mL Intravenous Once Kathryn Powell, Lajuan Lines, MD        Allergies as of 06/15/2021 - Review Complete 06/15/2021  Allergen Reaction Noted   Lisinopril Cough 07/13/2015   Sulfa antibiotics Hives and Rash 02/12/2011    Family History  Problem Relation Age of Onset   Hypertension Mother    Hypertension Brother    Colon cancer Neg Hx    Esophageal cancer Neg Hx    Rectal cancer Neg Hx    Stomach cancer Neg Hx     Social History   Socioeconomic History   Marital status: Divorced    Spouse name: Not on file   Number of children: Not on file   Years of education: Not on file   Highest education level: Not on file  Occupational History   Not on file  Tobacco Use   Smoking status: Never   Smokeless tobacco: Never  Vaping Use   Vaping Use: Never used  Substance and Sexual Activity   Alcohol use: Yes    Alcohol/week: 0.0 standard drinks    Comment: rare   Drug use: No   Sexual activity: Not on file  Other Topics Concern   Not on file  Social History Narrative   Not on file   Social Determinants of Health   Financial Resource Strain: Not on file  Food Insecurity: Not on file  Transportation Needs: Not on file  Physical Activity: Not on file  Stress: Not on file  Social Connections: Not on file  Intimate Partner Violence: Not on file    Physical Exam: Vital signs in last 24 hours: @BP  (!) 163/80    Pulse 67    Temp (!) 97.1 F (36.2 C)    Resp 12    Ht 5\' 6"  (1.676 m)    Wt 166 lb (75.3  kg)    SpO2 98%    BMI 26.79 kg/m  GEN: NAD EYE: Sclerae anicteric ENT: MMM CV: Non-tachycardic Pulm: CTA b/l GI: Soft, NT/ND NEURO:  Alert & Oriented x 3   Kathryn Jarred, MD [facility]  06/15/2021 1:33 PM

## 2021-06-15 NOTE — Patient Instructions (Signed)
YOU HAD AN ENDOSCOPIC PROCEDURE TODAY AT THE [facility]:   Refer to the procedure report that was given to you for any specific questions about what was found during the examination.  If the procedure report does not answer your questions, please call your gastroenterologist to clarify.  If you requested that your care partner not be given the details of your procedure findings, then the procedure report has been included in a sealed envelope for you to review at your convenience later.  YOU SHOULD EXPECT: Some feelings of bloating in the abdomen. Passage of more gas than usual.  Walking can help get rid of the air that was put into your GI tract during the procedure and reduce the bloating. If you had a lower endoscopy (such as a colonoscopy or flexible sigmoidoscopy) you may notice spotting of blood in your stool or on the toilet paper. If you underwent a bowel prep for your procedure, you may not have a normal bowel movement for a few days.  Please Note:  You might notice some irritation and congestion in your nose or some drainage.  This is from the oxygen used during your procedure.  There is no need for concern and it should clear up in a day or so.  SYMPTOMS TO REPORT IMMEDIATELY:   Following lower endoscopy (colonoscopy or flexible sigmoidoscopy):  Excessive amounts of blood in the stool  Significant tenderness or worsening of abdominal pains  Swelling of the abdomen that is new, acute  Fever of 100F or higher  For urgent or emergent issues, a gastroenterologist can be reached at any hour by calling [phone]. Do not use MyChart messaging for urgent concerns.    DIET:  We do recommend a small meal at first, but then you may proceed to your regular diet.  Drink plenty of fluids but you should avoid alcoholic beverages for 24 hours.  ACTIVITY:  You should plan to take it easy for the rest of today and you should NOT DRIVE or use heavy machinery until tomorrow (because  of the sedation medicines used during the test).    FOLLOW UP: Our staff will call the number listed on your records 48-72 hours following your procedure to check on you and address any questions or concerns that you may have regarding the information given to you following your procedure. If we do not reach you, we will leave a message.  We will attempt to reach you two times.  During this call, we will ask if you have developed any symptoms of COVID 19. If you develop any symptoms (ie: fever, flu-like symptoms, shortness of breath, cough etc.) before then, please call [phone].  If you test positive for Covid 19 in the 2 weeks post procedure, please call and report this information to us.    If any biopsies were taken you will be contacted by phone or by letter within the next 1-3 weeks.  Please call us at [phone] if you have not heard about the biopsies in 3 weeks.    SIGNATURES/CONFIDENTIALITY: You and/or your care partner have signed paperwork which will be entered into your electronic medical record.  These signatures attest to the fact that that the information above on your After Visit Summary has been reviewed and is understood.  Full responsibility of the confidentiality of this discharge information lies with you and/or your care-partner. 

## 2021-06-15 NOTE — Progress Notes (Signed)
D.T. vital signs. °

## 2021-06-15 NOTE — Progress Notes (Signed)
Report given to PACU, vss 

## 2021-06-15 NOTE — Op Note (Signed)
[facility] Patient Name: Shaterrica Territo Procedure Date: 06/15/2021 1:20 PM MRN: [id number] Endoscopist: Jerene Bears , MD Age: 81 Referring MD:  Date of Birth: April 11, 1941 Gender: Female Account #: [id number] Procedure:                Colonoscopy Indications:              High risk colon cancer surveillance: Personal                            history of colon cancer, Last colonoscopy: March                            2017 Medicines:                Monitored Anesthesia Care Procedure:                Pre-Anesthesia Assessment:                           - Prior to the procedure, a History and Physical                            was performed, and patient medications and                            allergies were reviewed. The patient's tolerance of                            previous anesthesia was also reviewed. The risks                            and benefits of the procedure and the sedation                            options and risks were discussed with the patient.                            All questions were answered, and informed consent                            was obtained. Prior Anticoagulants: The patient has                            taken no previous anticoagulant or antiplatelet                            agents. ASA Grade Assessment: III - A patient with                            severe systemic disease. After reviewing the risks                            and benefits, the patient was deemed in  satisfactory condition to undergo the procedure.                           After obtaining informed consent, the colonoscope                            was passed under direct vision. Throughout the                            procedure, the patient's blood pressure, pulse, and                            oxygen saturations were monitored continuously. The                            Olympus PCF-H190DL [id number]) Colonoscope was                             introduced through the anus and advanced to the                            cecum, identified by appendiceal orifice and                            ileocecal valve. The colonoscopy was performed                            without difficulty. The patient tolerated the                            procedure well. The quality of the bowel                            preparation was good. The ileocecal valve,                            appendiceal orifice, and rectum were photographed.                            The bowel preparation used was 2 day via split dose                            instruction. Scope In: 1:35:25 PM Scope Out: 1:55:14 PM Scope Withdrawal Time: 0 hours 10 minutes 55 seconds  Total Procedure Duration: 0 hours 19 minutes 49 seconds  Findings:                 The digital rectal exam was normal.                           There was evidence of a prior end-to-end                            colo-colonic anastomosis in the sigmoid colon. This  was patent and was characterized by healthy                            appearing mucosa.                           The exam was otherwise without abnormality on                            direct and retroflexion views. Complications:            No immediate complications. Estimated Blood Loss:     Estimated blood loss: none. Impression:               - Patent end-to-end colo-colonic anastomosis,                            characterized by healthy appearing mucosa.                           - The examination was otherwise normal on direct                            and retroflexion views.                           - No specimens collected. Recommendation:           - Patient has a contact number available for                            emergencies. The signs and symptoms of potential                            delayed complications were discussed with the                            patient. Return to  normal activities tomorrow.                            Written discharge instructions were provided to the                            patient.                           - Resume previous diet.                           - Continue present medications.                           - No repeat colonoscopy due to age and the absence                            of colonic polyps. Jerene Bears, MD 06/15/2021 1:59:24 PM This report has been signed electronically.

## 2021-06-19 ENCOUNTER — Telehealth: Payer: Self-pay

## 2021-06-19 NOTE — Telephone Encounter (Signed)
°  Follow up Call-  Call back number 06/15/2021  Post procedure Call Back phone  # [phone]  Permission to leave phone message Yes  Some recent data might be hidden     Patient questions:  Do you have a fever, pain , or abdominal swelling? No. Pain Score  0 *  Have you tolerated food without any problems? Yes.    Have you been able to return to your normal activities? Yes.    Do you have any questions about your discharge instructions: Diet   No. Medications  No. Follow up visit  No.  Do you have questions or concerns about your Care? No.  Actions: * If pain score is 4 or above: No action needed, pain <4.

## 2021-06-19 NOTE — Telephone Encounter (Signed)
Left message on follow up call. 

## 2021-06-29 ENCOUNTER — Encounter: Payer: Self-pay | Admitting: Cardiology

## 2021-06-29 ENCOUNTER — Ambulatory Visit: Payer: Medicare Other | Admitting: Cardiology

## 2021-06-29 VITALS — BP 150/82 | HR 73 | Ht 67.0 in | Wt 167.4 lb

## 2021-06-29 DIAGNOSIS — I25119 Atherosclerotic heart disease of native coronary artery with unspecified angina pectoris: Secondary | ICD-10-CM | POA: Diagnosis not present

## 2021-06-29 DIAGNOSIS — E782 Mixed hyperlipidemia: Secondary | ICD-10-CM | POA: Diagnosis not present

## 2021-06-29 MED ORDER — NITROGLYCERIN 0.4 MG SL SUBL: 0.4000 mg | SUBLINGUAL_TABLET | SUBLINGUAL | 3 refills | Status: AC | PRN

## 2021-06-29 NOTE — Progress Notes (Signed)
Cardiology Office Note  Date: 06/29/2021   ID: Tyshia, Fenter 15-Feb-1941, MRN [id number]  PCP:  Leeanne Rio, MD  Cardiologist:  Rozann Lesches, MD Electrophysiologist:  None   Chief Complaint  Patient presents with   Cardiac follow-up    History of Present Illness: Kathryn Powell is a 81 y.o. female last seen in December 2021.  She is here for a follow-up visit.  She tells me that she continues to do very well, exercises on her elliptical trainer for an hour at a time, 3 days a week.  No angina symptoms with this activity.  Also functional with ADLs and other chores.  She is overdue for lab work with PCP, anticipates getting this later in February.  Her last LDL was 63 on Lipitor.  I reviewed the remainder of her medications.  We also discussed getting a prescription for as needed nitroglycerin.  I personally reviewed her ECG today which shows normal sinus rhythm.  Past Medical History:  Diagnosis Date   Arthritis    Breast cancer ([facility])    Cataract    Colon cancer ([facility])    Coronary artery disease    DES to LAD 04/2014 ([facility])   Hypertension    Hypothyroidism    Mitral regurgitation     Past Surgical History:  Procedure Laterality Date   ARTHRODESIS METATARSALPHALANGEAL JOINT (MTPJ) Left 02/02/2016   Procedure: ARTHRODESIS LEFT METATARSALPHALANGEAL JOINT (MTPJ);  Surgeon: Wylene Simmer, MD;  Location: [facility];  Service: Orthopedics;  Laterality: Left;   BREAST LUMPECTOMY     left   BUNIONECTOMY     Bilateral   CARDIAC VALVE SURGERY  05/07/2014   heart stent placed per pt. /LAD   COLON SURGERY  over 10 years ago   COLONOSCOPY     FOOT SURGERY     with pins- right   HAMMER TOE SURGERY Left 02/02/2016   Procedure: LEFT SECOND MALLET TOE CORRECTION;  Surgeon: Wylene Simmer, MD;  Location: [facility];  Service: Orthopedics;  Laterality: Left;   METATARSAL OSTEOTOMY Left 02/02/2016   Procedure: 2 - 3 WEIL METATARSAL OSTEOTOMY;   Surgeon: Wylene Simmer, MD;  Location: [facility];  Service: Orthopedics;  Laterality: Left;   VAGINAL HYSTERECTOMY      Current Outpatient Medications  Medication Sig Dispense Refill   aspirin EC 81 MG tablet Take 81 mg by mouth daily.     atorvastatin (LIPITOR) 40 MG tablet TAKE 1 TABLET BY MOUTH  DAILY 90 tablet 3   calcium carbonate (OS-CAL - DOSED IN MG OF ELEMENTAL CALCIUM) 1250 MG tablet Take 1 tablet by mouth daily. 1000 mg     cholecalciferol (VITAMIN D) 1000 UNITS tablet Take 1,000 Units by mouth daily.     fish oil-omega-3 fatty acids 1000 MG capsule Take 1 g by mouth daily.      levothyroxine (SYNTHROID, LEVOTHROID) 100 MCG tablet Take 100 mcg by mouth daily.     Magnesium 200 MG TABS Take 400 mg by mouth daily.     MELATONIN-CHAMOMILE PO Take 1 tablet by mouth at bedtime.     metoprolol tartrate (LOPRESSOR) 25 MG tablet TAKE 1 TABLET BY MOUTH  TWICE DAILY 180 tablet 3   Multiple Vitamins-Minerals (MULTIVITAMIN,TX-MINERALS) tablet Take 1 tablet by mouth daily.     nitroGLYCERIN (NITROSTAT) 0.4 MG SL tablet Place 1 tablet (0.4 mg total) under the tongue every 5 (five) minutes x 3 doses as needed for chest pain (  if no relief after 2nd dose, proceed to ED or call 911). 25 tablet 3   triamterene-hydrochlorothiazide (DYAZIDE) 37.5-25 MG capsule Take 1 capsule by mouth daily.     No current facility-administered medications for this visit.   Allergies:  Lisinopril and Sulfa antibiotics   ROS: No palpitations or syncope.  Physical Exam: VS:  BP (!) 150/82    Pulse 73    Ht 5\' 7"  (1.702 m)    Wt 167 lb 6.4 oz (75.9 kg)    SpO2 97%    BMI 26.22 kg/m , BMI Body mass index is 26.22 kg/m.  Wt Readings from Last 3 Encounters:  06/29/21 167 lb 6.4 oz (75.9 kg)  06/15/21 166 lb (75.3 kg)  03/08/21 166 lb (75.3 kg)    General: Patient appears comfortable at rest. HEENT: Conjunctiva and lids normal, wearing a mask. Neck: Supple, no elevated JVP or carotid bruits, no  thyromegaly. Lungs: Clear to auscultation, nonlabored breathing at rest. Cardiac: Regular rate and rhythm, no S3 or significant systolic murmur, no pericardial rub. Extremities: No pitting edema.  ECG:  An ECG dated 05/16/2020 was personally reviewed today and demonstrated:  Sinus rhythm.  Recent Labwork:  November 2021: Hemoglobin 15.0, platelets 234, BUN 11, creatinine 0.8, potassium 4.3, AST 22, ALT 19, hemoglobin A1c 5.9%, cholesterol 174, triglycerides 112, HDL 92, LDL 63, TSH 1.29  Other Studies Reviewed Today:  Echocardiogram 11/12/2018:  1. The left ventricle has normal systolic function with an ejection fraction of 60-65%. The cavity size was normal. Left ventricular diastolic Doppler parameters are consistent with pseudonormalization. No evidence of left ventricular regional wall  motion abnormalities.  2. The right ventricle has normal systolic function. The cavity was normal. There is no increase in right ventricular wall thickness. Right ventricular systolic pressure estimated at 25.7 mmHg.  3. The aortic valve is tricuspid. Aortic valve regurgitation is trivial by color flow Doppler. Mild aortic annular calcification noted.  4. The mitral valve is grossly normal. There is mild mitral annular calcification present. Mitral valve regurgitation is mild to moderate by color flow Doppler. The MR jet is posteriorly-directed.  5. The tricuspid valve is grossly normal.  6. The aortic root is normal in size and structure.  Assessment and Plan:  1.  CAD status post DES to the LAD in 2015.  ECG is normal today and she continues to exercise regularly with no angina symptoms on medical therapy.  No clear indication for follow-up ischemic testing at this time.  Continue observation on aspirin, Lipitor, Lopressor, Dyazide, and as needed nitroglycerin.  2.  Mixed hyperlipidemia, continues on Lipitor.  Follow-up with PCP for lab work later this month as scheduled.  Medication Adjustments/Labs  and Tests Ordered: Current medicines are reviewed at length with the patient today.  Concerns regarding medicines are outlined above.   Tests Ordered: Orders Placed This Encounter  Procedures   EKG 12-Lead    Medication Changes: Meds ordered this encounter  Medications   nitroGLYCERIN (NITROSTAT) 0.4 MG SL tablet    Sig: Place 1 tablet (0.4 mg total) under the tongue every 5 (five) minutes x 3 doses as needed for chest pain (if no relief after 2nd dose, proceed to ED or call 911).    Dispense:  25 tablet    Refill:  3    Disposition:  Follow up  1 year.  Signed, Satira Sark, MD, [facility] 06/29/2021 4:15 PM    [facility],  [place], [place] [address] Phone: [phone]; Fax: [phone]

## 2021-06-29 NOTE — Patient Instructions (Addendum)
Medication Instructions:  Your physician recommends that you continue on your current medications as directed. Please refer to the Current Medication list given to you today. Nitroglycerin prescription sent to your pharmacy today. Dissolve one under tongue for chest pain every 5 minutes up to 3 doses. If no relief, after 2nd dose proceed to ED.  Labwork: none  Testing/Procedures: none  Follow-Up: Your physician recommends that you schedule a follow-up appointment in: 1 year. You will receive a reminder call or letter in the mail in about 10 months reminding you to call and schedule your appointment. If you don't receive this letter, please contact our office.  Any Other Special Instructions Will Be Listed Below (If Applicable).  If you need a refill on your cardiac medications before your next appointment, please call your pharmacy.

## 2021-09-30 ENCOUNTER — Other Ambulatory Visit: Payer: Self-pay | Admitting: Cardiology

## 2021-09-30 DIAGNOSIS — C189 Malignant neoplasm of colon, unspecified: Secondary | ICD-10-CM

## 2021-09-30 DIAGNOSIS — E782 Mixed hyperlipidemia: Secondary | ICD-10-CM

## 2021-10-10 ENCOUNTER — Other Ambulatory Visit (HOSPITAL_COMMUNITY): Payer: Self-pay | Admitting: Family Medicine

## 2021-10-10 DIAGNOSIS — Z1231 Encounter for screening mammogram for malignant neoplasm of breast: Secondary | ICD-10-CM

## 2021-11-20 ENCOUNTER — Ambulatory Visit (HOSPITAL_COMMUNITY)
Admission: RE | Admit: 2021-11-20 | Discharge: 2021-11-20 | Disposition: A | Discharge status: Home / Self Care | Payer: Medicare Other | Source: Ambulatory Visit | Attending: Family Medicine | Admitting: Family Medicine

## 2021-11-20 DIAGNOSIS — Z1231 Encounter for screening mammogram for malignant neoplasm of breast: Secondary | ICD-10-CM | POA: Insufficient documentation

## 2022-07-25 ENCOUNTER — Telehealth: Payer: Self-pay | Admitting: Cardiology

## 2022-07-25 NOTE — Telephone Encounter (Signed)
Message left on voicemail with number for medical records request [phone] Also message left advising to contact PCP to make new cardiology referral

## 2022-07-25 NOTE — Telephone Encounter (Signed)
Pt has moved and would like a referral to be faxed over to office below.   [facility]  Phone number: [phone]  Fax number: [phone]  [facility]  [address] [place], [place] [address]

## 2022-07-25 NOTE — Telephone Encounter (Signed)
Patient states that she is going to need her medical records and a referral sent to [facility].

## 2022-08-10 ENCOUNTER — Other Ambulatory Visit
Admission: RE | Admit: 2022-08-10 | Discharge: 2022-08-10 | Disposition: A | Discharge status: Home / Self Care | Payer: Medicare Other | Source: Ambulatory Visit | Attending: Cardiology | Admitting: Cardiology

## 2022-08-10 ENCOUNTER — Encounter: Payer: Self-pay | Admitting: Cardiology

## 2022-08-10 ENCOUNTER — Ambulatory Visit: Payer: Medicare Other | Admitting: Cardiology

## 2022-08-10 VITALS — BP 120/86 | HR 125 | Ht 67.0 in | Wt 165.6 lb

## 2022-08-10 DIAGNOSIS — E782 Mixed hyperlipidemia: Secondary | ICD-10-CM

## 2022-08-10 DIAGNOSIS — I4891 Unspecified atrial fibrillation: Secondary | ICD-10-CM | POA: Diagnosis not present

## 2022-08-10 DIAGNOSIS — I25119 Atherosclerotic heart disease of native coronary artery with unspecified angina pectoris: Secondary | ICD-10-CM | POA: Insufficient documentation

## 2022-08-10 LAB — BASIC METABOLIC PANEL
Anion gap: 12 (ref 5–15)
BUN: 17 mg/dL (ref 8–23)
CO2: 27 mmol/L (ref 22–32)
Calcium: 9.8 mg/dL (ref 8.9–10.3)
Chloride: 94 mmol/L — ABNORMAL LOW (ref 98–111)
Creatinine, Ser: 0.81 mg/dL (ref 0.44–1.00)
GFR, Estimated: >60 mL/min (ref >60)
Glucose, Bld: 111 mg/dL — ABNORMAL HIGH (ref 70–99)
Potassium: 3.6 mmol/L (ref 3.5–5.1)
Sodium: 133 mmol/L — ABNORMAL LOW (ref 135–145)

## 2022-08-10 LAB — CBC
HCT: 44.3 % (ref 36.0–46.0)
Hemoglobin: 15.1 g/dL — ABNORMAL HIGH (ref 12.0–15.0)
MCH: 31.7 pg (ref 26.0–34.0)
MCHC: 34.1 g/dL (ref 30.0–36.0)
MCV: 93.1 fL (ref 80.0–100.0)
Platelets: 214 10*3/uL (ref 150–400)
RBC: 4.76 MIL/uL (ref 3.87–5.11)
RDW: 12.6 % (ref 11.5–15.5)
WBC: 6.5 10*3/uL (ref 4.0–10.5)
nRBC: 0 % (ref 0.0–0.2)

## 2022-08-10 MED ORDER — METOPROLOL TARTRATE 25 MG PO TABS: 50.0000 mg | ORAL_TABLET | Freq: Two times a day (BID) | ORAL | 3 refills | Status: DC

## 2022-08-10 MED ORDER — APIXABAN 5 MG PO TABS: 5.0000 mg | ORAL_TABLET | Freq: Two times a day (BID) | ORAL | 1 refills | Status: AC

## 2022-08-10 MED ORDER — APIXABAN 5 MG PO TABS: 5.0000 mg | ORAL_TABLET | Freq: Two times a day (BID) | ORAL | 0 refills | Status: DC

## 2022-08-10 NOTE — Patient Instructions (Addendum)
Medication Instructions:   - STOP Triamterene-HCTZ  - STOP Aspirin  -START Eliquis 5 mg twice a day- I have given you a 30 day supply, plus a 30 day FREE TRIAL card    - INCREASE Lopressor to 50 mg twice a day (Take 2-25 mg tablets twice a day) You stated you just received a three month supply of the 25 mg tablets.  Labwork:  -STAT CBC,BMET  Testing/Procedures: None today  Follow-Up: 2 weeks with Dr.McDowell  Any Other Special Instructions Will Be Listed Below (If Applicable).   -Go to an Urgent Care tomorrow in [place] and tell them you have New Onset Atrial Fibrillation and you need a referral to a local Cardiologist.  If you need a refill on your cardiac medications before your next appointment, please call your pharmacy.

## 2022-08-10 NOTE — Progress Notes (Signed)
Cardiology Office Note  Date: 08/10/2022   ID: Kathryn, Powell Feb 13, 1941, MRN [id number]  History of Present Illness: Kathryn Powell is a 82 y.o. female last seen in February 2023.  She presents to the office today, drove up with a friend from [facility] where she has lived since December 2023.  She does not have a PCP or a cardiologist in that area yet.  States that she has done well until earlier this week when she started to feel more weak and unsteady, no angina or definitive sense of palpitations, however she is found to be in newly documented rapid atrial fibrillation by ECG today.  CHA2DS2-VASc score is 5.  We discussed diagnosis of atrial fibrillation in terms of both symptoms and stroke risk, also treatment options.  I reviewed her ECG today which shows atrial fibrillation with RVR, normal septal Q waves although had decreased R wave progression anteroseptally by prior tracing in sinus rhythm and no definitive prior history of infarct.  She states that she has to drive back with her friend to [facility] today.  This certainly complicates management of her arrhythmia, we discussed several options.  Physical Exam: VS:  BP 120/86   Pulse (!) 125   Ht 5\' 7"  (1.702 m)   Wt 165 lb 9.6 oz (75.1 kg)   SpO2 98%   BMI 25.94 kg/m , BMI Body mass index is 25.94 kg/m.  Wt Readings from Last 3 Encounters:  08/10/22 165 lb 9.6 oz (75.1 kg)  06/29/21 167 lb 6.4 oz (75.9 kg)  06/15/21 166 lb (75.3 kg)    General: Patient appears comfortable at rest. HEENT: Conjunctiva and lids normal. Neck: Supple, no elevated JVP or carotid bruits. Lungs: Clear to auscultation, nonlabored breathing at rest. Cardiac: Irregular without gallop, soft systolic murmur at the apex. Abdomen: Soft, bowel sounds present. Extremities: No pitting edema, distal pulses 2+.  ECG:  An ECG dated 06/29/2021 was personally reviewed today and demonstrated:  Sinus  rhythm.  Labwork:  February 2023: Hemoglobin 14.4, platelets 212, BUN 13, creatinine 0.73, potassium 4.2, AST 26, ALT 19, cholesterol 157, triglycerides 79, HDL 80, LDL 62, hemoglobin A1c 5.6%, TSH 2.07  Other Studies Reviewed Today:  No interval cardiac testing for review today.  Assessment and Plan:  1.  Newly documented atrial fibrillation with RVR, presumably rhythm onset earlier this week with symptoms of weakness.  CHA2DS2-VASc score is 4.  We discussed stroke risk and rhythm management strategies.  She is now living in [facility] and has no PCP or cardiologist as yet, in fact has to drive back today.  This makes treatment fairly difficult to say the least.  We had a long discussion today and she is in agreement with the following plan: We are sending her for a stat CBC and BMET for baseline.  Unless there are substantial abnormalities that limit anticoagulation, we will have her stop aspirin and have given her samples for Eliquis 5 mg twice daily.  Increase Lopressor to 50 mg twice daily and stop Dyazide for now.  I have asked her to be evaluated at a local urgent care in her area within the next 24 hours for repeat vital signs and more importantly to assist helping her get local follow-up with a cardiovascular specialist in her area to continue management of her atrial fibrillation.  We can certainly provide records as needed.  We will set up a follow-up here as well just in case.  2.  CAD status post DES to the LAD in December 2015 in the [facility] system.  LVEF 60 to 65% by echocardiogram in 2020.  She does not report any angina, even in the setting of rapid atrial fibrillation.  She has been on aspirin, Lopressor, and Lipitor.  Plan to stop aspirin if Eliquis is initiated as discussed above.  2.  Mixed hyperlipidemia, LDL 62 in February 2023.  She continues on Lipitor.  3.  Mitral regurgitation, mild to moderate by echocardiogram in 2020.  She will need a follow-up  echocardiogram in the process of her workup for atrial fibrillation to reassess LVEF and also ensure that mitral regurgitation has not progressed.  Disposition:  Follow up  2 weeks unless she establishes with cardiovascular specialist in her area first.  Signed, Satira Sark, M.D., F.A.C.C.

## 2022-08-13 ENCOUNTER — Other Ambulatory Visit: Payer: Self-pay | Admitting: *Deleted

## 2022-08-13 MED ORDER — APIXABAN 5 MG PO TABS: 5.0000 mg | ORAL_TABLET | Freq: Two times a day (BID) | ORAL | 1 refills | Status: DC

## 2022-08-13 NOTE — Telephone Encounter (Signed)
Prescription refill request for Eliquis received. Indication: AF Last office visit: 08/10/22  Myles Gip MD Scr: 0.81 on 08/10/22 Age: 82 Weight: 75.1kg  Based on above findings Eliquis 5mg  twice daily is the appropriate dose.  Refill approved.

## 2022-08-30 ENCOUNTER — Other Ambulatory Visit: Payer: Self-pay | Admitting: Cardiology

## 2022-08-30 DIAGNOSIS — E782 Mixed hyperlipidemia: Secondary | ICD-10-CM

## 2022-12-12 ENCOUNTER — Other Ambulatory Visit: Payer: Self-pay | Admitting: Cardiology

## 2022-12-13 NOTE — Telephone Encounter (Signed)
Prescription refill request for Eliquis received. Indication: AF Last office visit: 08/10/22 Scr: 0.81 on 08/10/22  Epic Age: 82 Weight: 75.1kg  Based on above findings Eliquis 5mg  twice daily is the appropriate dose.  Refill approved.

## 2023-01-30 IMAGING — MG MM DIGITAL SCREENING BILAT W/ TOMO AND CAD
8 series · 9 of 24 positions shown · non-contrast
Comparison: Previous exam(s).

CLINICAL DATA: Screening.

EXAM:
DIGITAL SCREENING BILATERAL MAMMOGRAM WITH TOMOSYNTHESIS AND CAD
TECHNIQUE: Bilateral screening digital craniocaudal and mediolateral oblique
mammograms were obtained. Bilateral screening digital breast
tomosynthesis was performed. The images were evaluated with
computer-aided detection.

[L MLO synth-2D]
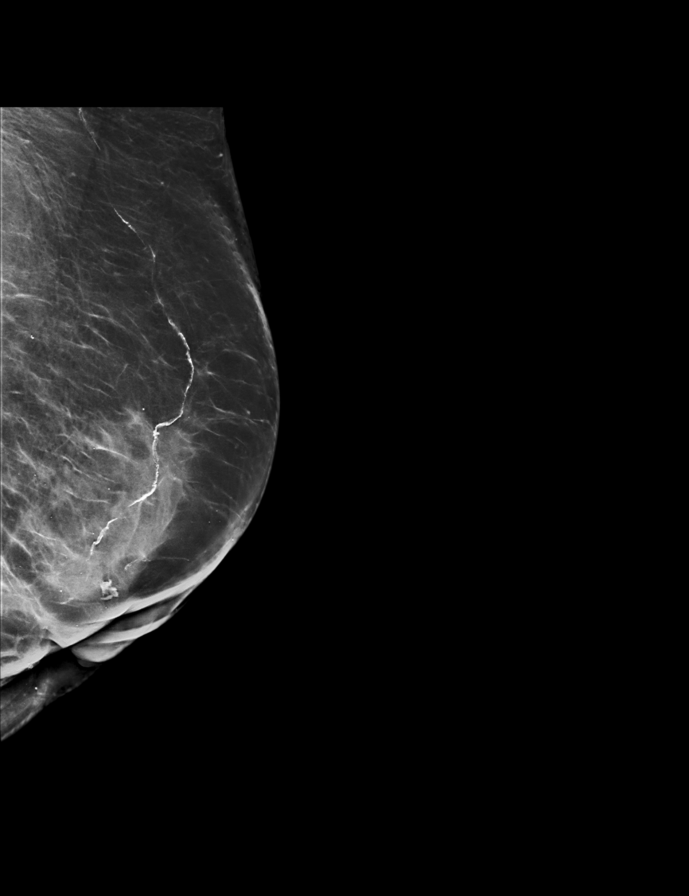

[R MLO synth-2D]
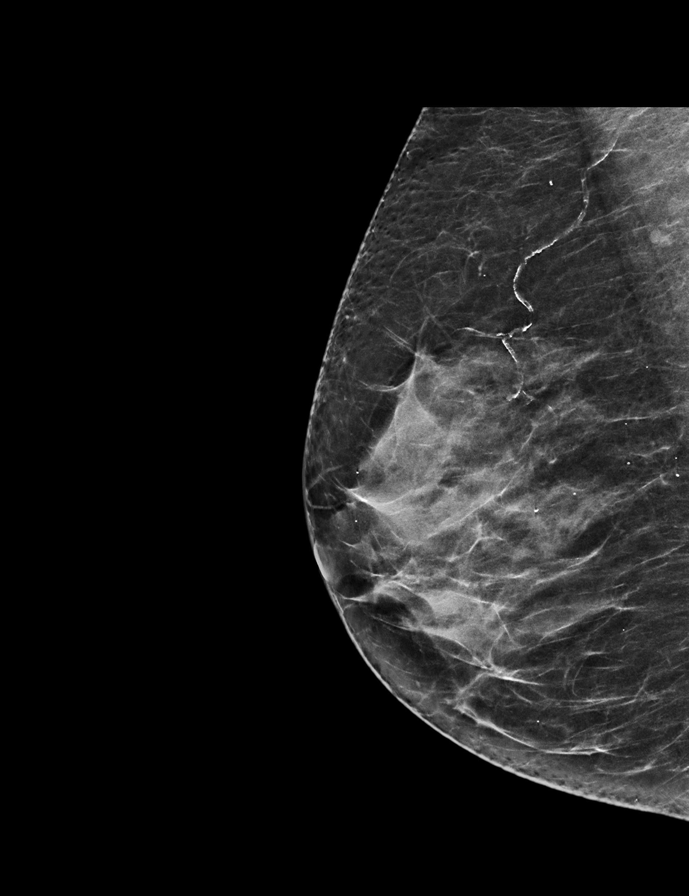

[L CC synth-2D]
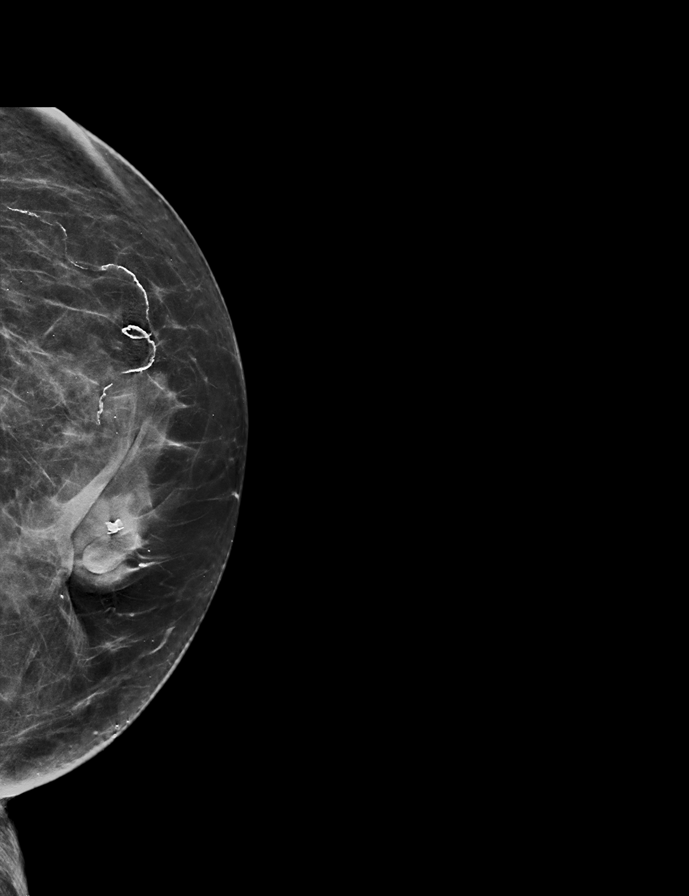

[R CC synth-2D]
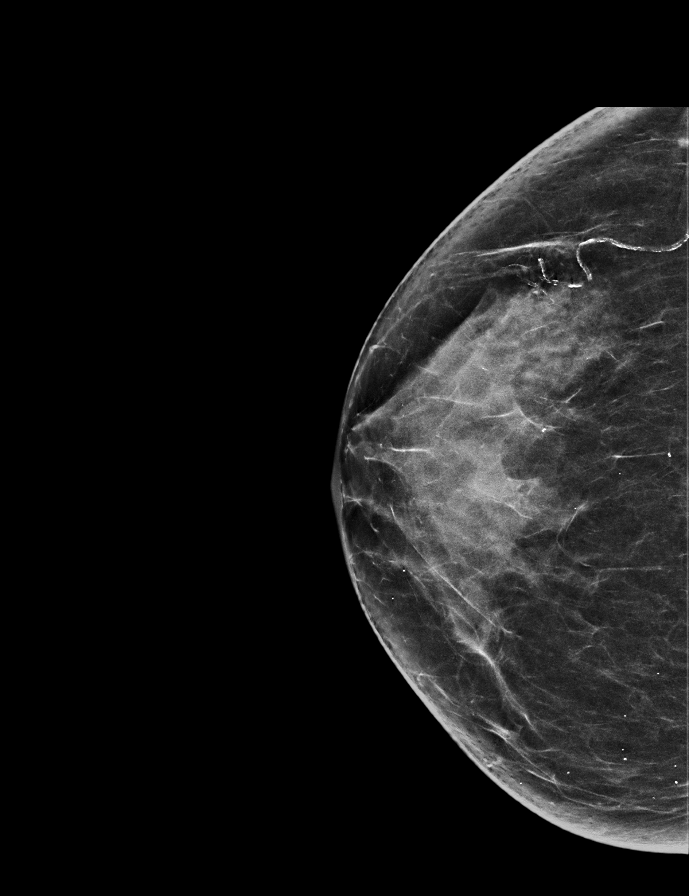

[R MLO tomo · 2 of 63 frames shown]
[frame 21/63]
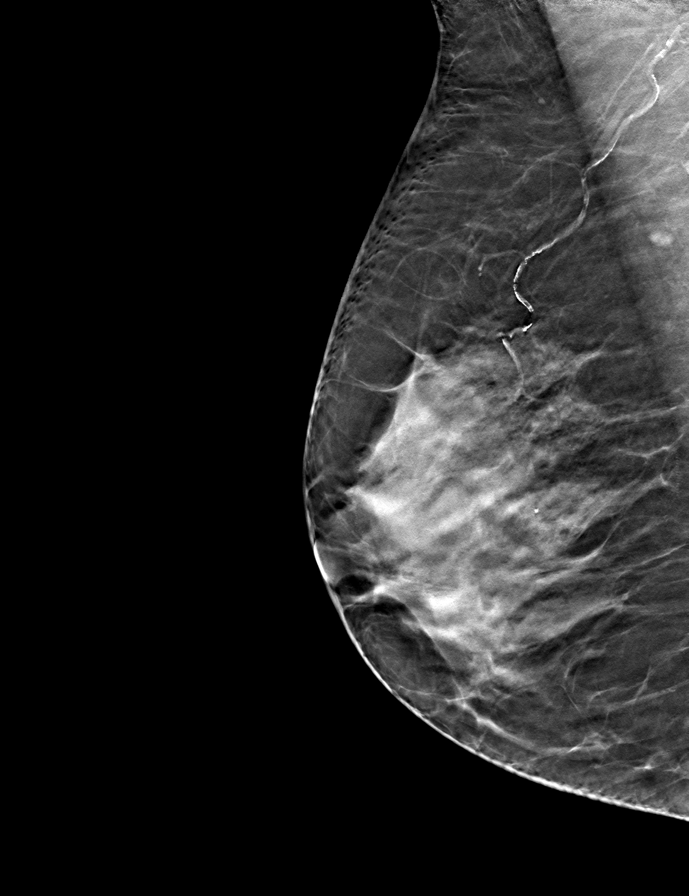
[frame 32/63]
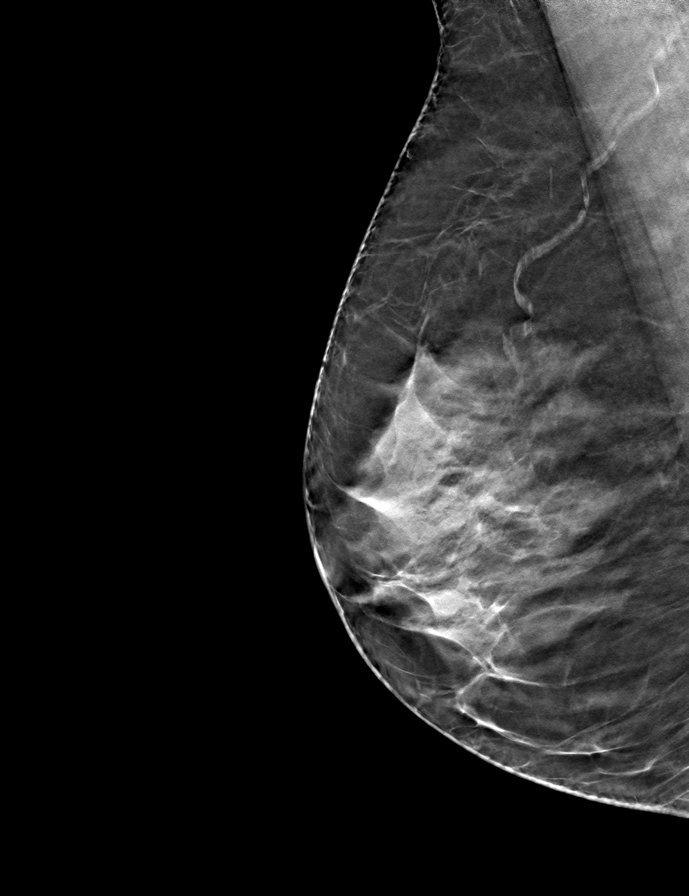

[R CC tomo · tomo slice 32/63.0]
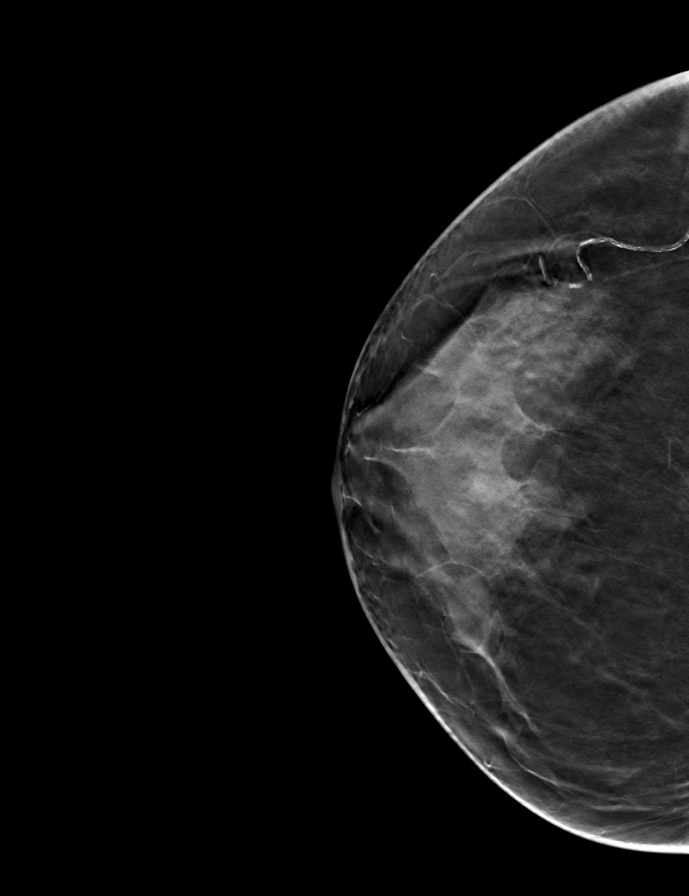

[L MLO tomo · tomo slice 38/75.0]
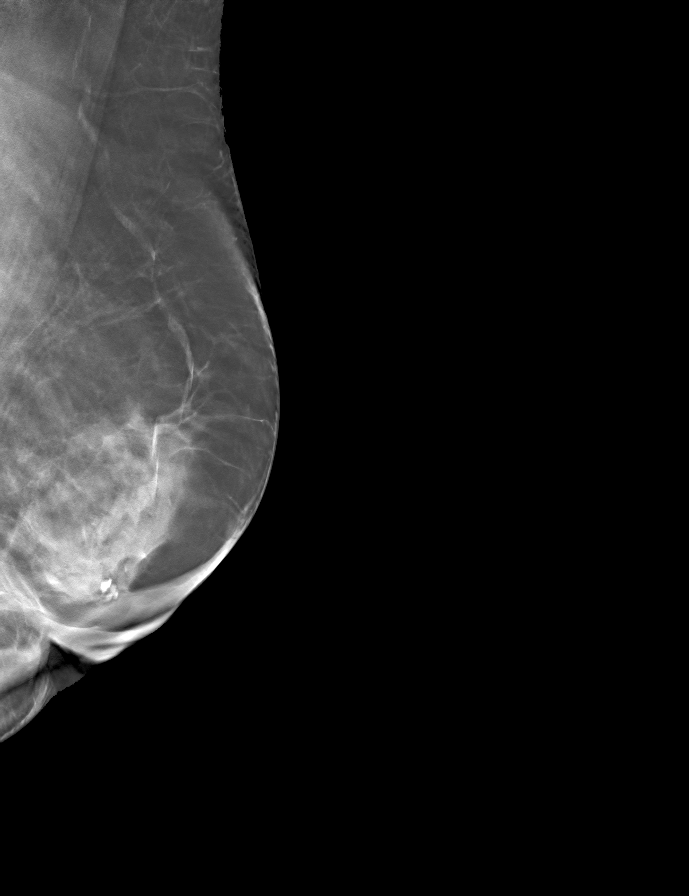

[L CC tomo · tomo slice 35/69.0]
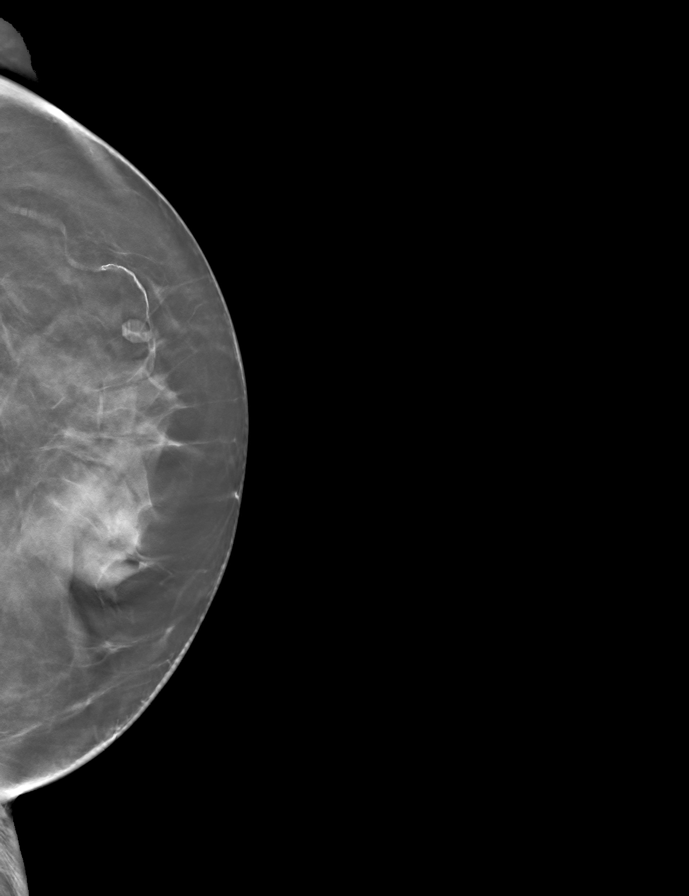

[9 of 24 positions shown; findings below may reference images not displayed]

ACR Breast Density Category c: The breast tissue is heterogeneously
dense, which may obscure small masses.
FINDINGS: There are no findings suspicious for malignancy.
IMPRESSION: No mammographic evidence of malignancy. A result letter of this
screening mammogram will be mailed directly to the patient.

RECOMMENDATION:
Screening mammogram in one year. (Code:Q3-W-BC3)

BI-RADS CATEGORY  1: Negative.
